# Patient Record
Sex: Female | Born: 1955
Health system: Southern US, Community
[De-identification: ages and names within clinical notes are randomized; demographics above are authoritative.]

## PROBLEM LIST (undated history)

## (undated) DIAGNOSIS — M1990 Unspecified osteoarthritis, unspecified site: Secondary | ICD-10-CM

## (undated) DIAGNOSIS — J189 Pneumonia, unspecified organism: Secondary | ICD-10-CM

## (undated) DIAGNOSIS — E119 Type 2 diabetes mellitus without complications: Secondary | ICD-10-CM

## (undated) DIAGNOSIS — Z87442 Personal history of urinary calculi: Secondary | ICD-10-CM

## (undated) DIAGNOSIS — K279 Peptic ulcer, site unspecified, unspecified as acute or chronic, without hemorrhage or perforation: Secondary | ICD-10-CM

## (undated) HISTORY — PX: LITHOTRIPSY: SUR834

## (undated) HISTORY — PX: ANKLE ARTHROSCOPY W/ OPEN REPAIR: SHX1145

---

## 1982-09-11 HISTORY — PX: TUBAL LIGATION: SHX77

## 2005-11-01 ENCOUNTER — Ambulatory Visit (HOSPITAL_COMMUNITY): Admission: RE | Admit: 2005-11-01 | Discharge: 2005-11-01 | Payer: Self-pay | Admitting: Family Medicine

## 2009-05-13 ENCOUNTER — Ambulatory Visit (HOSPITAL_COMMUNITY): Admission: RE | Admit: 2009-05-13 | Discharge: 2009-05-13 | Payer: Self-pay | Admitting: Orthopaedic Surgery

## 2010-12-16 LAB — GLUCOSE, CAPILLARY

## 2010-12-17 LAB — COMPREHENSIVE METABOLIC PANEL
ALT: 22 U/L (ref 0–35)
AST: 18 U/L (ref 0–37)
Albumin: 3.5 g/dL (ref 3.5–5.2)
Alkaline Phosphatase: 55 U/L (ref 39–117)
BUN: 8 mg/dL (ref 6–23)
CO2: 25 mEq/L (ref 19–32)
Calcium: 9.3 mg/dL (ref 8.4–10.5)
GFR calc Af Amer: >60 mL/min (ref >60)
GFR calc non Af Amer: >60 mL/min (ref >60)
Glucose, Bld: 112 mg/dL — ABNORMAL HIGH (ref 70–99)
Potassium: 3.7 mEq/L (ref 3.5–5.1)
Total Bilirubin: 0.6 mg/dL (ref 0.3–1.2)
Total Protein: 6.2 g/dL (ref 6.0–8.3)

## 2010-12-17 LAB — CBC
Hemoglobin: 12.8 g/dL (ref 12.0–15.0)
Platelets: 355 10*3/uL (ref 150–400)
RBC: 4.12 MIL/uL (ref 3.87–5.11)
WBC: 10.1 10*3/uL (ref 4.0–10.5)

## 2010-12-17 LAB — URINE MICROSCOPIC-ADD ON

## 2010-12-17 LAB — URINALYSIS, ROUTINE W REFLEX MICROSCOPIC: Urobilinogen, UA: 0.2 mg/dL (ref 0.0–1.0)

## 2010-12-17 LAB — DIFFERENTIAL
Basophils Absolute: 0 10*3/uL (ref 0.0–0.1)
Basophils Relative: 0 % (ref 0–1)
Eosinophils Absolute: 0.4 10*3/uL (ref 0.0–0.7)
Neutrophils Relative %: 64 % (ref 43–77)

## 2011-01-24 NOTE — H&P (Signed)
NAMEDORIE, Sarah Kent                ACCOUNT NO.:  [id number]   MEDICAL RECORD NO.:  [id number]          PATIENT TYPE:  AMB   LOCATION:  DAY                           FACILITY:  [facility]   PHYSICIAN:  J. Darreld Mclean, M.D. DATE OF BIRTH:  15-Nov-1955   DATE OF ADMISSION:  DATE OF DISCHARGE:  LH                              HISTORY & PHYSICAL   She is scheduled for surgery tomorrow the 2nd.   CHIEF COMPLAINT:  Right knee pain.   SUBJECTIVE:  The patient is a 55 year old female with pain and  tenderness in her right knee for several months.  She has been having  intermittent pain to the right knee for some time now.  I saw her for  this in April of this year.  Her pain in the knee has gotten worse.  Previously, I have seen her over the years with mild pain into the right  knee.  Knee pain got progressively worse during the summer and an MRI  was obtained at [facility].  MRI was done because of poor response to anti-  inflammatories and the usual taking her weight off of it and an  injection.  MRI showed a tear of the posterior on the medial meniscus  with a section extending to the meniscal root and effusion,  tricompartmental arthritis.  She also had a large effusion.  Surgery was  recommended as her poor response to conservative treatment.  Risks and  imponderables of the procedure were explained to her.   PAST MEDICAL HISTORY:  History is positive for a history of ulcer  disease.   ALLERGIES:  She has no allergies.   CURRENT MEDICATIONS:  1. Norethindrone 5 mg 2 daily.  2. Metformin 500 mg 2 daily.  3. ASA 81 mg daily.  4. Lisinopril 10 mg daily.  5. Calcitrate, calcium and vitamin D3.  6. She takes Vicodin 5 for pain.   PAST SURGICAL HISTORY:  1. The patient is status post tubal ligation in 1986.  2. Trimalleolar fracture of her left ankle on August 18, 1985, by me      with subsequent removal of screws the following year.  3. Tonsillectomy in 1987.   FAMILY HISTORY:   History of arthritis runs in the family.   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   PHYSICAL EXAMINATION:  VITAL SIGNS: Normal.  HEENT: Negative.  NECK:  Supple.  LUNGS: Clear to P and A.  HEART: Regular without murmur.  ABDOMEN:  Soft, nontender without masses.  EXTREMITIES:  Right knee has a mild effusion.  Pain and tenderness  particularly medially with a positive medial McMurray's.  Range of  motion was from 0-95 degrees with pain.  Other extremities are negative.  There is a well-healed scar to the left ankle with full range of motion.  CNS: Intact.  SKIN:  Intact.   IMPRESSION:  Tear of medial meniscus right knee with degenerative joint  disease.   PLAN:  Arthroscopy of the right knee.  Discussed with the patient  planned  procedure, risks and imponderables.  She appears to understand.  Labs are pending.                                            ______________________________  J. Darreld Mclean, M.D.     JWK/MEDQ  D:  05/12/2009  T:  05/12/2009  Job:  [id number]

## 2011-01-24 NOTE — Op Note (Signed)
Sarah, Kent                ACCOUNT NO.:  [id number]   MEDICAL RECORD NO.:  [id number]          PATIENT TYPE:  AMB   LOCATION:  DAY                           FACILITY:  [facility]   PHYSICIAN:  J. Darreld Mclean, M.D. DATE OF BIRTH:  March 04, 1956   DATE OF PROCEDURE:  DATE OF DISCHARGE:                               OPERATIVE REPORT   PREOPERATIVE DIAGNOSES:  Tear of the right knee, medial meniscus with  degenerative joint disease.   POSTOPERATIVE DIAGNOSES:  Tear of the right knee, medial meniscus with  degenerative joint disease.   PROCEDURES:  Operative arthroscopy, partial medial meniscectomy of the  right knee.   ANESTHESIA:  General.   TOURNIQUET TIME:  25 minutes.   DRAINS:  None.   INDICATIONS:  The patient is a 55 year old female with pain and  tenderness in her right knee.  It has been progressive, it was not  getting better, it has not improved with conservative treatment, rest,  injection, and antiinflammatories.  MRI shows tear of the medial  meniscus with degenerative joint disease as well.  Surgery has been  recommended.  Risks and imponderables were discussed preoperatively and  the patient appeared to understand and agree with the procedure as  outlined.   The patient was seen in the holding area.  The right knee was identified  as correct surgical site, she placed a mark on the right knee, I placed  a mark on the right knee.  She was brought to the operating room, given  general anesthesia while supine.  Tourniquet and leg holder were placed,  deflated right upper thigh.  She was prepped and draped in usual  fashion.  At a generalized time-out, identified the patient as Mr.  Halberg between the right knee.  All instrumentation deemed to be  properly working and in position.  Leg was elevated, wrapped  circumferentially with an Esmarch bandage.  Tourniquet inflated to 300  mmHg.  Esmarch bandage removed.  Inflow cannula inserted medially and  lactated Ringer  instilled in knee by an infusion pump.  Arthroscope was  inserted laterally.   The knee was systematically examined.  Suprapatellar pouch had mild  grade 2 changes undersurface of the patella and mild synovitis.  Medially, there was a grade 3 to grade 4 changes of the tibial plateau  and the femoral condyle most medially.  There was significant bone  exposure, eburnation in couple places.  There was a tear at the  posterior horn of the medial meniscus.  Anterior cruciate was intact.  Laterally, the knee looked very good with grade 2 changes, some slight  fraying around the meniscus edges.  Attention directed back to the  medial side and meniscal probe, meniscal knife and a meniscal shaver,  the meniscus was removed in the posterior portion where the tear was.  Good smooth contour was obtained.  Meniscal punch was used as well.  Pertinent pictures were taken.  The knee was then systematically  reexamined and no new pathology found.  Knee was irrigated with main  part of lactated Ringer.  Wounds were reapproximated using  3-0 nylon  interrupted vertical mattress manner.  Marcaine 0.25%  instilled at each portal.  Tourniquet deflated after 25 minutes.  The  patient was given a bulky dressing.  She was given Vicodin ES for pain.  I will see in the office in approximately 10 days 2 weeks.  Physical  therapy has been arranged.  If any difficulties, contact me through the  office hospital beeper system.           ______________________________  Shela Commons. Darreld Mclean, M.D.     JWK/MEDQ  D:  05/13/2009  T:  05/13/2009  Job:  [id number]

## 2011-08-10 ENCOUNTER — Ambulatory Visit (HOSPITAL_COMMUNITY)
Admission: RE | Admit: 2011-08-10 | Discharge: 2011-08-10 | Disposition: A | Discharge status: Home / Self Care | Payer: 59 | Source: Ambulatory Visit | Attending: Urology | Admitting: Urology

## 2011-08-10 ENCOUNTER — Other Ambulatory Visit (HOSPITAL_COMMUNITY): Payer: Self-pay | Admitting: Urology

## 2011-08-10 DIAGNOSIS — N200 Calculus of kidney: Secondary | ICD-10-CM

## 2011-08-10 DIAGNOSIS — Z09 Encounter for follow-up examination after completed treatment for conditions other than malignant neoplasm: Secondary | ICD-10-CM | POA: Insufficient documentation

## 2015-11-24 ENCOUNTER — Ambulatory Visit: Payer: Self-pay | Admitting: Orthopaedic Surgery

## 2015-12-01 ENCOUNTER — Ambulatory Visit (INDEPENDENT_AMBULATORY_CARE_PROVIDER_SITE_OTHER): Payer: Commercial Managed Care - HMO | Admitting: Orthopaedic Surgery

## 2015-12-01 VITALS — BP 114/69 | HR 94 | Temp 97.5°F | Ht 62.0 in | Wt 219.4 lb

## 2015-12-01 DIAGNOSIS — M25561 Pain in right knee: Secondary | ICD-10-CM | POA: Diagnosis not present

## 2015-12-01 NOTE — Progress Notes (Addendum)
Sarah Kent, female DOB:May 15, 1956, 60 y.o. AVW:[id number]  Chief Complaint  Sarah presents with  . Follow-up    Right Knee follow up "same"    HPI  Sarah Kent is a 60 y.o. female who has chronic knee pain of both knees, more on the right. She has no new trauma.  She has swelling and popping. She has no giving way, no redness, no locking.  She takes her medicine.  Knee Pain  The pain is present in the right knee. The quality of the pain is described as aching. The pain is at a severity of 3/10. The pain is mild. The pain has been fluctuating since onset. The symptoms are aggravated by weight bearing. She has tried NSAIDs, rest, ice and heat for the symptoms. The treatment provided moderate relief.    Body mass index is 40.12 kg/(m^2).  Review of Systems  Sarah has Diabetes Mellitus. Sarah does not have hypertension. Sarah does not have COPD or shortness of breath. Sarah has BMI > 35. Sarah does not have current smoking history.  Review of Systems  HENT: Negative for congestion.   Respiratory: Negative for shortness of breath.   Cardiovascular: Negative for chest pain.  Endocrine: Positive for cold intolerance.  Musculoskeletal: Positive for joint swelling, arthralgias and gait problem.  Allergic/Immunologic: Positive for environmental allergies.    No past medical history on file.  No past surgical history on file.  No family history on file.  Social History Social History  Substance Use Topics  . Smoking status: Not on file  . Smokeless tobacco: Not on file  . Alcohol Use: Not on file    No Known Allergies  Current Outpatient Prescriptions  Medication Sig Dispense Refill  . aspirin 81 MG tablet Take 81 mg by mouth daily.    . Liraglutide (VICTOZA) 18 MG/3ML SOPN Inject 1.2 mg into the skin once.    Marland Kitchen lisinopril (PRINIVIL,ZESTRIL) 10 MG tablet Take 10 mg by mouth daily.    . metFORMIN (GLUCOPHAGE) 500 MG tablet Take 500 mg by mouth 2  (two) times daily with a meal.    . simvastatin (ZOCOR) 10 MG tablet Take 10 mg by mouth daily.     No current facility-administered medications for this visit.     Physical Exam  Blood pressure 114/69, pulse 94, temperature 97.5 F (36.4 C), height  (1.575 m), weight 219 lb 6.4 oz (99.519 kg).  Constitutional: overall normal hygiene, normal nutrition, well developed, normal grooming, normal body habitus. Assistive device:none  Musculoskeletal: gait and station Limp right, muscle tone and strength are normal, no tremors or atrophy is present.  .  Neurological: coordination overall normal.  Deep tendon reflex/nerve stretch intact.  Sensation normal.  Cranial nerves II-XII intact.   Skin:   normal overall no scars, lesions, ulcers or rashes. No psoriasis.  Psychiatric: Alert and oriented x 3.  Recent memory intact, remote memory unclear.  Normal mood and affect. Well groomed.  Good eye contact.  Cardiovascular: overall no swelling, no varicosities, no edema bilaterally, normal temperatures of the legs and arms, no clubbing, cyanosis and good capillary refill.  Lymphatic: palpation is normal.  The right lower extremity is examined:  Inspection:  Thigh:  Non-tender and no defects  Knee has swelling 2+ effusion.                        Joint tenderness is present  Sarah is tender over the medial joint line  Lower Leg:  Has normal appearance and no tenderness or defects  Ankle:  Non-tender and no defects  Foot:  Non-tender and no defects Range of Motion:  Knee:  Range of motion is: 0-100                        Crepitus is  present  Ankle:  Range of motion is normal. Strength and Tone:  The right lower extremity has normal strength and tone. Stability:  Knee:  The knee is stable.  Ankle:  The ankle is stable.    The Sarah has been educated about the nature of the problem(s) and counseled on treatment options.  The Sarah appeared to understand  what I have discussed and is in agreement with it.  PLAN Call if any problems.  Precautions discussed.  Continue current medications.   Return to clinic PRN

## 2016-01-12 ENCOUNTER — Telehealth: Payer: Self-pay | Admitting: Orthopaedic Surgery

## 2016-01-12 NOTE — Telephone Encounter (Signed)
Hydrocodone-Acetaminophen 7.5/325mg Qty 120 Tablets °

## 2016-01-13 MED ORDER — HYDROCODONE-ACETAMINOPHEN 7.5-325 MG PO TABS: 1.0000 | ORAL_TABLET | ORAL | Status: DC | PRN

## 2016-01-13 NOTE — Telephone Encounter (Signed)
Rx done. 

## 2016-06-21 ENCOUNTER — Ambulatory Visit (INDEPENDENT_AMBULATORY_CARE_PROVIDER_SITE_OTHER): Payer: Commercial Managed Care - HMO | Admitting: Orthopaedic Surgery

## 2016-06-21 ENCOUNTER — Encounter: Payer: Self-pay | Admitting: Orthopaedic Surgery

## 2016-06-21 VITALS — BP 116/60 | HR 100 | Temp 97.7°F | Ht 62.0 in | Wt 210.0 lb

## 2016-06-21 DIAGNOSIS — G8929 Other chronic pain: Secondary | ICD-10-CM | POA: Diagnosis not present

## 2016-06-21 DIAGNOSIS — M25561 Pain in right knee: Secondary | ICD-10-CM

## 2016-06-21 MED ORDER — HYDROCODONE-ACETAMINOPHEN 7.5-325 MG PO TABS: 1.0000 | ORAL_TABLET | ORAL | 0 refills | Status: DC | PRN

## 2016-06-21 NOTE — Progress Notes (Signed)
Patient ZO:XWRUE:Sarah Kent, female DOB:[birth year], 60 y.o. AVW:[id number]  Chief Complaint  Patient presents with  . Follow-up    Right knee pain    HPI  Sarah SquiresKathy D Kent is a 60 y.o. female who has chronic pain of the right knee. She has no new trauma.  She has swelling and popping. She is worse on rainy days and first thing in the mornings.  She has no giving way, no redness.  She is active. HPI  Body mass index is 38.41 kg/m.  ROS  Review of Systems  HENT: Negative for congestion.   Respiratory: Negative for shortness of breath.   Cardiovascular: Negative for chest pain.  Endocrine: Positive for cold intolerance.  Musculoskeletal: Positive for arthralgias, gait problem and joint swelling.  Allergic/Immunologic: Positive for environmental allergies.    History reviewed. No pertinent past medical history.  History reviewed. No pertinent surgical history.  History reviewed. No pertinent family history.  Social History Social History  Substance Use Topics  . Smoking status: Never Smoker  . Smokeless tobacco: Never Used  . Alcohol use No    No Known Allergies  Current Outpatient Prescriptions  Medication Sig Dispense Refill  . aspirin 81 MG tablet Take 81 mg by mouth daily.    Marland Kitchen. HYDROcodone-acetaminophen (NORCO) 7.5-325 MG tablet Take 1 tablet by mouth every 4 (four) hours as needed for moderate pain (Must last 30 days.  Do not drive or operate machinery while taking this medicine.). 120 tablet 0  . Liraglutide (VICTOZA) 18 MG/3ML SOPN Inject 1.2 mg into the skin once.    Marland Kitchen. lisinopril (PRINIVIL,ZESTRIL) 10 MG tablet Take 10 mg by mouth daily.    . metFORMIN (GLUCOPHAGE) 500 MG tablet Take 500 mg by mouth 2 (two) times daily with a meal.    . simvastatin (ZOCOR) 10 MG tablet Take 10 mg by mouth daily.     No current facility-administered medications for this visit.      Physical Exam  Blood pressure 116/60, pulse 100, temperature 97.7 F (36.5 C), height 5\' 2"   (1.575 m), weight 210 lb (95.3 kg).  Constitutional: overall normal hygiene, normal nutrition, well developed, normal grooming, normal body habitus. Assistive device:none  Musculoskeletal: gait and station Limp right, muscle tone and strength are normal, no tremors or atrophy is present.  .  Neurological: coordination overall normal.  Deep tendon reflex/nerve stretch intact.  Sensation normal.  Cranial nerves II-XII intact.   Skin:   Normal overall no scars, lesions, ulcers or rashes. No psoriasis.  Psychiatric: Alert and oriented x 3.  Recent memory intact, remote memory unclear.  Normal mood and affect. Well groomed.  Good eye contact.  Cardiovascular: overall no swelling, no varicosities, no edema bilaterally, normal temperatures of the legs and arms, no clubbing, cyanosis and good capillary refill.  Lymphatic: palpation is normal.  The right lower extremity is examined:  Inspection:  Thigh:  Non-tender and no defects  Knee has swelling 1+ effusion.                        Joint tenderness is present                        Patient is tender over the medial joint line  Lower Leg:  Has normal appearance and no tenderness or defects  Ankle:  Non-tender and no defects  Foot:  Non-tender and no defects Range of Motion:  Knee:  Range of motion  is: 0-105                        Crepitus is  present  Ankle:  Range of motion is normal. Strength and Tone:  The right lower extremity has normal strength and tone. Stability:  Knee:  The knee is stable.  Ankle:  The ankle is stable.    The patient has been educated about the nature of the problem(s) and counseled on treatment options.  The patient appeared to understand what I have discussed and is in agreement with it.  Encounter Diagnosis  Name Primary?  . Chronic pain of right knee Yes    PLAN Call if any problems.  Precautions discussed.  Continue current medications.   Return to clinic prn   Electronically Signed Darreld Mclean, MD 10/11/20179:25 AM

## 2016-10-06 DIAGNOSIS — R05 Cough: Secondary | ICD-10-CM | POA: Diagnosis not present

## 2017-01-09 DIAGNOSIS — E782 Mixed hyperlipidemia: Secondary | ICD-10-CM | POA: Diagnosis not present

## 2017-01-09 DIAGNOSIS — E7800 Pure hypercholesterolemia, unspecified: Secondary | ICD-10-CM | POA: Diagnosis not present

## 2017-01-09 DIAGNOSIS — E1165 Type 2 diabetes mellitus with hyperglycemia: Secondary | ICD-10-CM | POA: Diagnosis not present

## 2017-01-09 DIAGNOSIS — I10 Essential (primary) hypertension: Secondary | ICD-10-CM | POA: Diagnosis not present

## 2017-01-16 DIAGNOSIS — I10 Essential (primary) hypertension: Secondary | ICD-10-CM | POA: Diagnosis not present

## 2017-01-16 DIAGNOSIS — E1140 Type 2 diabetes mellitus with diabetic neuropathy, unspecified: Secondary | ICD-10-CM | POA: Diagnosis not present

## 2017-01-16 DIAGNOSIS — E782 Mixed hyperlipidemia: Secondary | ICD-10-CM | POA: Diagnosis not present

## 2017-03-01 ENCOUNTER — Ambulatory Visit (INDEPENDENT_AMBULATORY_CARE_PROVIDER_SITE_OTHER): Payer: 59 | Admitting: Orthopaedic Surgery

## 2017-03-01 ENCOUNTER — Encounter: Payer: Self-pay | Admitting: Orthopaedic Surgery

## 2017-03-01 VITALS — BP 127/61 | HR 94 | Temp 97.5°F | Ht 62.0 in | Wt 213.0 lb

## 2017-03-01 DIAGNOSIS — M25561 Pain in right knee: Secondary | ICD-10-CM | POA: Diagnosis not present

## 2017-03-01 DIAGNOSIS — G8929 Other chronic pain: Secondary | ICD-10-CM

## 2017-03-01 MED ORDER — HYDROCODONE-ACETAMINOPHEN 7.5-325 MG PO TABS: 1.0000 | ORAL_TABLET | ORAL | 0 refills | Status: DC | PRN

## 2017-03-01 NOTE — Progress Notes (Signed)
Patient MV:HQION:Sarah Kent, female DOB:[birth year], 61 y.o. GEX:[id number]  Chief Complaint  Patient presents with  . Follow-up    Right Knee    HPI  Sarah Kent is a 61 y.o. female who has chronic pain of the her right knee.  She has swelling and popping but no giving way.  She has no redness or trauma.  She is active.  She went up to [employer] [place] last week and has more pain. HPI  Body mass index is 38.96 kg/m.  ROS  Review of Systems  HENT: Negative for congestion.   Respiratory: Negative for shortness of breath.   Cardiovascular: Negative for chest pain.  Endocrine: Positive for cold intolerance.  Musculoskeletal: Positive for arthralgias, gait problem and joint swelling.  Allergic/Immunologic: Positive for environmental allergies.    History reviewed. No pertinent past medical history.  History reviewed. No pertinent surgical history.  History reviewed. No pertinent family history.  Social History Social History  Substance Use Topics  . Smoking status: Never Smoker  . Smokeless tobacco: Never Used  . Alcohol use No    No Known Allergies  Current Outpatient Prescriptions  Medication Sig Dispense Refill  . aspirin 81 MG tablet Take 81 mg by mouth daily.    Marland Kitchen. HYDROcodone-acetaminophen (NORCO) 7.5-325 MG tablet Take 1 tablet by mouth every 4 (four) hours as needed for moderate pain (Must last 30 days.  Do not drive or operate machinery while taking this medicine.). 120 tablet 0  . Liraglutide (VICTOZA) 18 MG/3ML SOPN Inject 1.2 mg into the skin once.    Marland Kitchen. lisinopril (PRINIVIL,ZESTRIL) 10 MG tablet Take 10 mg by mouth daily.    . metFORMIN (GLUCOPHAGE) 500 MG tablet Take 500 mg by mouth 2 (two) times daily with a meal.    . simvastatin (ZOCOR) 10 MG tablet Take 10 mg by mouth daily.     No current facility-administered medications for this visit.      Physical Exam  Blood pressure 127/61, pulse 94, temperature 97.5 F (36.4 C), height 5\' 2"  (1.575 m),  weight 213 lb (96.6 kg).  Constitutional: overall normal hygiene, normal nutrition, well developed, normal grooming, normal body habitus. Assistive device:none  Musculoskeletal: gait and station Limp right, muscle tone and strength are normal, no tremors or atrophy is present.  .  Neurological: coordination overall normal.  Deep tendon reflex/nerve stretch intact.  Sensation normal.  Cranial nerves II-XII intact.   Skin:   Normal overall no scars, lesions, ulcers or rashes. No psoriasis.  Psychiatric: Alert and oriented x 3.  Recent memory intact, remote memory unclear.  Normal mood and affect. Well groomed.  Good eye contact.  Cardiovascular: overall no swelling, no varicosities, no edema bilaterally, normal temperatures of the legs and arms, no clubbing, cyanosis and good capillary refill.  Lymphatic: palpation is normal.  The right lower extremity is examined:  Inspection:  Thigh:  Non-tender and no defects  Knee has swelling 1+ effusion.                        Joint tenderness is present                        Patient is tender over the medial joint line  Lower Leg:  Has normal appearance and no tenderness or defects  Ankle:  Non-tender and no defects  Foot:  Non-tender and no defects Range of Motion:  Knee:  Range of motion is:  0-105                        Crepitus is  present  Ankle:  Range of motion is normal. Strength and Tone:  The right lower extremity has normal strength and tone. Stability:  Knee:  The knee is stable.  Ankle:  The ankle is stable.    The patient has been educated about the nature of the problem(s) and counseled on treatment options.  The patient appeared to understand what I have discussed and is in agreement with it.  Encounter Diagnosis  Name Primary?  . Chronic pain of right knee Yes    PLAN Call if any problems.  Precautions discussed.  Continue current medications.   Return to clinic prn   I have reviewed the [facility]  Controlled Substance Reporting System web site prior to prescribing narcotic medicine for this patient.  Electronically Signed Darreld Mclean, MD 6/21/201810:04 AM

## 2017-03-05 ENCOUNTER — Telehealth: Payer: Self-pay | Admitting: Orthopaedic Surgery

## 2017-03-05 NOTE — Telephone Encounter (Signed)
Patient called and stated that her insurance, which is [employer], will not cover her Hydrocodone.  [phone] to see what needs to be done so that she may get her medication.  Thanks

## 2017-03-07 NOTE — Telephone Encounter (Signed)
I spoke with the patient and explained that Dr. Hilda Kent can not keep writing the pain medication.  However, her family doctor can take over and possibly get the approval since that Dr is responsible for her over all health and regulates all of her medicine.

## 2017-05-09 DIAGNOSIS — E1122 Type 2 diabetes mellitus with diabetic chronic kidney disease: Secondary | ICD-10-CM | POA: Diagnosis not present

## 2017-05-09 DIAGNOSIS — E1165 Type 2 diabetes mellitus with hyperglycemia: Secondary | ICD-10-CM | POA: Diagnosis not present

## 2017-05-09 DIAGNOSIS — E1140 Type 2 diabetes mellitus with diabetic neuropathy, unspecified: Secondary | ICD-10-CM | POA: Diagnosis not present

## 2017-05-21 DIAGNOSIS — Z23 Encounter for immunization: Secondary | ICD-10-CM | POA: Diagnosis not present

## 2017-05-21 DIAGNOSIS — I10 Essential (primary) hypertension: Secondary | ICD-10-CM | POA: Diagnosis not present

## 2017-05-21 DIAGNOSIS — E1165 Type 2 diabetes mellitus with hyperglycemia: Secondary | ICD-10-CM | POA: Diagnosis not present

## 2017-05-21 DIAGNOSIS — E782 Mixed hyperlipidemia: Secondary | ICD-10-CM | POA: Diagnosis not present

## 2017-06-13 DIAGNOSIS — Z23 Encounter for immunization: Secondary | ICD-10-CM | POA: Diagnosis not present

## 2017-06-29 DIAGNOSIS — Z1231 Encounter for screening mammogram for malignant neoplasm of breast: Secondary | ICD-10-CM | POA: Diagnosis not present

## 2017-07-18 DIAGNOSIS — Z01419 Encounter for gynecological examination (general) (routine) without abnormal findings: Secondary | ICD-10-CM | POA: Diagnosis not present

## 2017-09-12 DIAGNOSIS — E1122 Type 2 diabetes mellitus with diabetic chronic kidney disease: Secondary | ICD-10-CM | POA: Diagnosis not present

## 2017-09-12 DIAGNOSIS — E7800 Pure hypercholesterolemia, unspecified: Secondary | ICD-10-CM | POA: Diagnosis not present

## 2017-09-12 DIAGNOSIS — E1165 Type 2 diabetes mellitus with hyperglycemia: Secondary | ICD-10-CM | POA: Diagnosis not present

## 2017-09-12 DIAGNOSIS — Z0000 Encounter for general adult medical examination without abnormal findings: Secondary | ICD-10-CM | POA: Diagnosis not present

## 2017-09-18 DIAGNOSIS — E1165 Type 2 diabetes mellitus with hyperglycemia: Secondary | ICD-10-CM | POA: Diagnosis not present

## 2017-09-18 DIAGNOSIS — E782 Mixed hyperlipidemia: Secondary | ICD-10-CM | POA: Diagnosis not present

## 2017-09-18 DIAGNOSIS — I10 Essential (primary) hypertension: Secondary | ICD-10-CM | POA: Diagnosis not present

## 2017-10-18 ENCOUNTER — Ambulatory Visit: Payer: 59 | Admitting: Orthopaedic Surgery

## 2017-10-18 ENCOUNTER — Encounter: Payer: Self-pay | Admitting: Orthopaedic Surgery

## 2017-10-18 VITALS — BP 114/58 | HR 101 | Temp 98.2°F | Ht 62.0 in | Wt 214.0 lb

## 2017-10-18 DIAGNOSIS — G8929 Other chronic pain: Secondary | ICD-10-CM

## 2017-10-18 DIAGNOSIS — I10 Essential (primary) hypertension: Secondary | ICD-10-CM | POA: Insufficient documentation

## 2017-10-18 DIAGNOSIS — M25561 Pain in right knee: Secondary | ICD-10-CM | POA: Diagnosis not present

## 2017-10-18 MED ORDER — HYDROCODONE-ACETAMINOPHEN 7.5-325 MG PO TABS: 1.0000 | ORAL_TABLET | Freq: Four times a day (QID) | ORAL | 0 refills | Status: DC | PRN

## 2017-10-18 NOTE — Progress Notes (Signed)
Patient ZO:XWRUE:Sarah Kent, female DOB:1956-07-08, 62 y.o. AVW:[id number]  Chief Complaint  Patient presents with  . Knee Pain    right    HPI  Sarah Kent is a 62 y.o. female who has chronic pain of the right knee.  She has popping, swelling.  She is a candidate for a total knee.  She prefers to hold off for now.  She has good and bad days, worse on cold rainy days.  She has no new trauma. HPI  Body mass index is 39.14 kg/m.  ROS  Review of Systems  HENT: Negative for congestion.   Respiratory: Negative for shortness of breath.   Cardiovascular: Negative for chest pain.  Endocrine: Positive for cold intolerance.  Musculoskeletal: Positive for arthralgias, gait problem and joint swelling.  Allergic/Immunologic: Positive for environmental allergies.  All other systems reviewed and are negative.   History reviewed. No pertinent past medical history.  History reviewed. No pertinent surgical history.  History reviewed. No pertinent family history.  Social History Social History   Tobacco Use  . Smoking status: Never Smoker  . Smokeless tobacco: Never Used  Substance Use Topics  . Alcohol use: No  . Drug use: No    No Known Allergies  Current Outpatient Medications  Medication Sig Dispense Refill  . aspirin 81 MG tablet Take 81 mg by mouth daily.    Marland Kitchen. HYDROcodone-acetaminophen (NORCO) 7.5-325 MG tablet Take 1 tablet by mouth every 6 (six) hours as needed for moderate pain (Must last 30 days). 120 tablet 0  . Liraglutide (VICTOZA) 18 MG/3ML SOPN Inject 1.2 mg into the skin once.    Marland Kitchen. lisinopril (PRINIVIL,ZESTRIL) 10 MG tablet Take 10 mg by mouth daily.    . metFORMIN (GLUCOPHAGE) 500 MG tablet Take 500 mg by mouth 2 (two) times daily with a meal.    . simvastatin (ZOCOR) 10 MG tablet Take 10 mg by mouth daily.     No current facility-administered medications for this visit.      Physical Exam  Blood pressure (!) 114/58, pulse (!) 101, temperature 98.2 F (36.8  C), height 5\' 2"  (1.575 m), weight 214 lb (97.1 kg).  Constitutional: overall normal hygiene, normal nutrition, well developed, normal grooming, normal body habitus. Assistive device:none  Musculoskeletal: gait and station Limp right, muscle tone and strength are normal, no tremors or atrophy is present.  .  Neurological: coordination overall normal.  Deep tendon reflex/nerve stretch intact.  Sensation normal.  Cranial nerves II-XII intact.   Skin:   Normal overall no scars, lesions, ulcers or rashes. No psoriasis.  Psychiatric: Alert and oriented x 3.  Recent memory intact, remote memory unclear.  Normal mood and affect. Well groomed.  Good eye contact.  Cardiovascular: overall no swelling, no varicosities, no edema bilaterally, normal temperatures of the legs and arms, no clubbing, cyanosis and good capillary refill.  Lymphatic: palpation is normal.  The right lower extremity is examined:  Inspection:  Thigh:  Non-tender and no defects  Knee has swelling 1+ effusion.                        Joint tenderness is present                        Patient is tender over the medial joint line  Lower Leg:  Has normal appearance and no tenderness or defects  Ankle:  Non-tender and no defects  Foot:  Non-tender and  no defects Range of Motion:  Knee:  Range of motion is: 0-105                        Crepitus is  present  Ankle:  Range of motion is normal. Strength and Tone:  The right lower extremity has normal strength and tone. Stability:  Knee:  The knee is stable.  Ankle:  The ankle is stable.   All other systems reviewed and are negative   The patient has been educated about the nature of the problem(s) and counseled on treatment options.  The patient appeared to understand what I have discussed and is in agreement with it.  Encounter Diagnosis  Name Primary?  . Chronic pain of right knee Yes    PLAN Call if any problems.  Precautions discussed.  Continue current  medications.   Return to clinic prn   I have reviewed the [facility] Controlled Substance Reporting System web site prior to prescribing narcotic medicine for this patient.  Electronically Signed Darreld Mclean, MD 2/7/201910:56 AM

## 2018-01-09 DIAGNOSIS — E1165 Type 2 diabetes mellitus with hyperglycemia: Secondary | ICD-10-CM | POA: Diagnosis not present

## 2018-01-09 DIAGNOSIS — E1140 Type 2 diabetes mellitus with diabetic neuropathy, unspecified: Secondary | ICD-10-CM | POA: Diagnosis not present

## 2018-01-09 DIAGNOSIS — E1122 Type 2 diabetes mellitus with diabetic chronic kidney disease: Secondary | ICD-10-CM | POA: Diagnosis not present

## 2018-01-16 DIAGNOSIS — E1165 Type 2 diabetes mellitus with hyperglycemia: Secondary | ICD-10-CM | POA: Diagnosis not present

## 2018-01-16 DIAGNOSIS — I10 Essential (primary) hypertension: Secondary | ICD-10-CM | POA: Diagnosis not present

## 2018-01-16 DIAGNOSIS — E782 Mixed hyperlipidemia: Secondary | ICD-10-CM | POA: Diagnosis not present

## 2018-05-03 DIAGNOSIS — E1140 Type 2 diabetes mellitus with diabetic neuropathy, unspecified: Secondary | ICD-10-CM | POA: Diagnosis not present

## 2018-05-03 DIAGNOSIS — E1165 Type 2 diabetes mellitus with hyperglycemia: Secondary | ICD-10-CM | POA: Diagnosis not present

## 2018-05-03 DIAGNOSIS — E1122 Type 2 diabetes mellitus with diabetic chronic kidney disease: Secondary | ICD-10-CM | POA: Diagnosis not present

## 2018-05-06 DIAGNOSIS — I10 Essential (primary) hypertension: Secondary | ICD-10-CM | POA: Diagnosis not present

## 2018-05-06 DIAGNOSIS — E1165 Type 2 diabetes mellitus with hyperglycemia: Secondary | ICD-10-CM | POA: Diagnosis not present

## 2018-05-06 DIAGNOSIS — E782 Mixed hyperlipidemia: Secondary | ICD-10-CM | POA: Diagnosis not present

## 2018-06-11 DIAGNOSIS — J019 Acute sinusitis, unspecified: Secondary | ICD-10-CM | POA: Diagnosis not present

## 2018-06-11 DIAGNOSIS — Z6836 Body mass index (BMI) 36.0-36.9, adult: Secondary | ICD-10-CM | POA: Diagnosis not present

## 2018-06-18 DIAGNOSIS — H40013 Open angle with borderline findings, low risk, bilateral: Secondary | ICD-10-CM | POA: Diagnosis not present

## 2018-06-19 DIAGNOSIS — Z23 Encounter for immunization: Secondary | ICD-10-CM | POA: Diagnosis not present

## 2018-07-04 DIAGNOSIS — Z1231 Encounter for screening mammogram for malignant neoplasm of breast: Secondary | ICD-10-CM | POA: Diagnosis not present

## 2018-07-11 ENCOUNTER — Encounter: Payer: Self-pay | Admitting: Orthopaedic Surgery

## 2018-07-11 ENCOUNTER — Ambulatory Visit: Payer: 59 | Admitting: Orthopaedic Surgery

## 2018-07-11 ENCOUNTER — Telehealth: Payer: Self-pay | Admitting: Orthopaedic Surgery

## 2018-07-11 VITALS — BP 125/77 | HR 90 | Ht 62.0 in | Wt 207.0 lb

## 2018-07-11 DIAGNOSIS — G8929 Other chronic pain: Secondary | ICD-10-CM

## 2018-07-11 DIAGNOSIS — M25561 Pain in right knee: Secondary | ICD-10-CM

## 2018-07-11 MED ORDER — HYDROCODONE-ACETAMINOPHEN 7.5-325 MG PO TABS: 1.0000 | ORAL_TABLET | Freq: Four times a day (QID) | ORAL | 0 refills | Status: DC | PRN

## 2018-07-11 NOTE — Telephone Encounter (Signed)
Patient stated it was discussed about pain medication this morning at her visit and it was supposed to be sent, but the pharmacy hasn't received anything.  Hydrocodone-Acetaminophen  7.5/325 mg  Qty 120 Tablets?  PATIENT USES [facility]

## 2018-07-11 NOTE — Progress Notes (Signed)
Patient Sarah Kent, female DOB:15-Aug-1956, 62 y.o. JYN:[id number]  Chief Complaint  Patient presents with  . Knee Pain    HPI  Sarah Kent is a 62 y.o. female who has continuing pain of the right knee.  She has significant DJD.  She is here to talk about a total knee.  She has decided to proceed to have a total knee.  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  I do not do any surgery now.  She would like to see Dr. [name] in [place].  I will make an appointment for her to see him.  She would like to have the surgery in late spring if possible.     Body mass index is 37.86 kg/m.  ROS  Review of Systems  HENT: Negative for congestion.   Respiratory: Negative for shortness of breath.   Cardiovascular: Negative for chest pain.  Endocrine: Positive for cold intolerance.  Musculoskeletal: Positive for arthralgias, gait problem and joint swelling.  Allergic/Immunologic: Positive for environmental allergies.  All other systems reviewed and are negative.   All other systems reviewed and are negative.  The following is a summary of the past history medically, past history surgically, known current medicines, social history and family history.  This information is gathered electronically by the computer from prior information and documentation.  I review this each visit and have found including this information at this point in the chart is beneficial and informative.    No past medical history on file.  No past surgical history on file.  No family history on file.  Social History Social History   Tobacco Use  . Smoking status: Never Smoker  . Smokeless tobacco: Never Used  Substance Use Topics  . Alcohol use: No  . Drug use: No    No Known Allergies  Current Outpatient Medications  Medication Sig Dispense Refill  . aspirin 81 MG tablet Take 81 mg by mouth daily.    Marland Kitchen HYDROcodone-acetaminophen (NORCO) 7.5-325 MG tablet Take 1 tablet by mouth  every 6 (six) hours as needed for moderate pain (Must last 30 days). 120 tablet 0  . Liraglutide (VICTOZA) 18 MG/3ML SOPN Inject 1.2 mg into the skin once.    Marland Kitchen lisinopril (PRINIVIL,ZESTRIL) 10 MG tablet Take 10 mg by mouth daily.    . metFORMIN (GLUCOPHAGE) 500 MG tablet Take 500 mg by mouth 2 (two) times daily with a meal.    . simvastatin (ZOCOR) 10 MG tablet Take 10 mg by mouth daily.     No current facility-administered medications for this visit.      Physical Exam  Blood pressure 125/77, pulse 90, height 5\' 2"  (1.575 m), weight 207 lb (93.9 kg).  Constitutional: overall normal hygiene, normal nutrition, well developed, normal grooming, normal body habitus. Assistive device:none  Musculoskeletal: gait and station Limp right, muscle tone and strength are normal, no tremors or atrophy is present.  .  Neurological: coordination overall normal.  Deep tendon reflex/nerve stretch intact.  Sensation normal.  Cranial nerves II-XII intact.   Skin:   Normal overall no scars, lesions, ulcers or rashes. No psoriasis.  Psychiatric: Alert and oriented x 3.  Recent memory intact, remote memory unclear.  Normal mood and affect. Well groomed.  Good eye contact.  Cardiovascular: overall no swelling, no varicosities, no edema bilaterally, normal temperatures of the legs and arms, no clubbing, cyanosis and good capillary refill.  Lymphatic: palpation is normal.  Right knee has crepitus and effusion,  ROM 0 to 105 with some pain.  Limp to the right.  Muscle tone and strength normal.  NV intact.  All other systems reviewed and are negative   The patient has been educated about the nature of the problem(s) and counseled on treatment options.  The patient appeared to understand what I have discussed and is in agreement with it.  Encounter Diagnosis  Name Primary?  . Chronic pain of right knee Yes    PLAN Call if any problems.  Precautions discussed.  Continue current medications.   Return to  clinic to see Dr. [name] for possible total knee on the right.   Electronically Signed Darreld Mclean, MD 10/31/20198:45 AM

## 2018-07-23 ENCOUNTER — Telehealth: Payer: Self-pay | Admitting: Orthopaedic Surgery

## 2018-07-23 NOTE — Telephone Encounter (Signed)
Spoke with Sarah Kent who states Dr. [name] office needed more information but was very vague on what they needed. Attempted to call [facility] with no answer. Informed Sarah Kent that I will call office again tomorrow to see if there's something that needs to be provided from this office.

## 2018-07-23 NOTE — Telephone Encounter (Signed)
Patient called stating that Dr. Deri FuellingAluisio's office called telling her that they needed more information. She is aware you will give her a call after clinic today. She stated she would be home around 4 pm today if you can't reach her then, please call in the morning.

## 2018-07-25 NOTE — Telephone Encounter (Signed)
Called [facility] and all of patient information has been received. They are awaiting provider review/authorization before appointment is booked. Pt notified of all information and verbalized understanding.

## 2018-08-20 DIAGNOSIS — Z01419 Encounter for gynecological examination (general) (routine) without abnormal findings: Secondary | ICD-10-CM | POA: Diagnosis not present

## 2018-08-27 DIAGNOSIS — E1140 Type 2 diabetes mellitus with diabetic neuropathy, unspecified: Secondary | ICD-10-CM | POA: Diagnosis not present

## 2018-08-27 DIAGNOSIS — E1165 Type 2 diabetes mellitus with hyperglycemia: Secondary | ICD-10-CM | POA: Diagnosis not present

## 2018-08-27 DIAGNOSIS — E1122 Type 2 diabetes mellitus with diabetic chronic kidney disease: Secondary | ICD-10-CM | POA: Diagnosis not present

## 2018-08-30 DIAGNOSIS — E1165 Type 2 diabetes mellitus with hyperglycemia: Secondary | ICD-10-CM | POA: Diagnosis not present

## 2018-08-30 DIAGNOSIS — E782 Mixed hyperlipidemia: Secondary | ICD-10-CM | POA: Diagnosis not present

## 2018-08-30 DIAGNOSIS — I10 Essential (primary) hypertension: Secondary | ICD-10-CM | POA: Diagnosis not present

## 2018-09-24 DIAGNOSIS — M1711 Unilateral primary osteoarthritis, right knee: Secondary | ICD-10-CM | POA: Diagnosis not present

## 2018-09-24 DIAGNOSIS — M25561 Pain in right knee: Secondary | ICD-10-CM | POA: Diagnosis not present

## 2018-11-13 NOTE — H&P (Signed)
TOTAL KNEE ADMISSION H&P  Patient is being admitted for right total knee arthroplasty.  Subjective:  Chief Complaint:right knee pain.  HPI: Sarah Kent, 63 y.o. female, has a history of pain and functional disability in the right knee due to arthritis and has failed non-surgical conservative treatments for greater than 12 weeks to includecorticosteriod injections and activity modification.  Onset of symptoms was gradual, starting 5 years ago with gradually worsening course since that time. The patient noted prior procedures on the knee to include  arthroscopy on the right knee(s).  Patient currently rates pain in the right knee(s) at 7 out of 10 with activity. Patient has worsening of pain with activity and weight bearing, pain that interferes with activities of daily living and joint swelling.  Patient has evidence of bone-on-bone arthritis in the medial and patellofemoral compartments with significant varus deformity by imaging studies. There is no active infection.  Patient Active Problem List   Diagnosis Date Noted  . Essential hypertension 10/18/2017   No past medical history on file.  No past surgical history on file.  No current facility-administered medications for this encounter.    Current Outpatient Medications  Medication Sig Dispense Refill Last Dose  . aspirin 81 MG tablet Take 81 mg by mouth daily.   Taking  . HYDROcodone-acetaminophen (NORCO) 7.5-325 MG tablet Take 1 tablet by mouth every 6 (six) hours as needed for moderate pain (Must last 30 days). 120 tablet 0   . Liraglutide (VICTOZA) 18 MG/3ML SOPN Inject 1.2 mg into the skin once.   Taking  . lisinopril (PRINIVIL,ZESTRIL) 10 MG tablet Take 10 mg by mouth daily.   Taking  . metFORMIN (GLUCOPHAGE) 500 MG tablet Take 500 mg by mouth 2 (two) times daily with a meal.   Taking  . simvastatin (ZOCOR) 10 MG tablet Take 10 mg by mouth daily.   Taking   No Known Allergies  Social History   Tobacco Use  . Smoking status:  Never Smoker  . Smokeless tobacco: Never Used  Substance Use Topics  . Alcohol use: No    No family history on file.   Review of Systems  Constitutional: Negative for chills and fever.  HENT: Negative for congestion, sore throat and tinnitus.   Eyes: Negative for double vision, photophobia and pain.  Respiratory: Negative for cough, shortness of breath and wheezing.   Cardiovascular: Negative for chest pain, palpitations and orthopnea.  Gastrointestinal: Negative for heartburn, nausea and vomiting.  Genitourinary: Negative for dysuria, frequency and urgency.  Musculoskeletal: Positive for joint pain.  Neurological: Negative for dizziness, weakness and headaches.    Objective:  Physical Exam  Well nourished and well developed. General: Alert and oriented x3, cooperative and pleasant, no acute distress. Head: normocephalic, atraumatic, neck supple. Eyes: EOMI. Respiratory: breath sounds clear in all fields, no wheezing, rales, or rhonchi. Cardiovascular: Regular rate and rhythm, no murmurs, gallops or rubs.  Abdomen: non-tender to palpation and soft, normoactive bowel sounds.  Musculoskeletal: Left Knee Exam:  No effusion. No Swelling. Range of motion is 0-125 degrees.  No crepitus on range of motion of the knee.  No medial joint line tenderness No lateral joint line tenderness.  Stable knee.  Right Knee Exam:  Varus deformity.  No effusion. No Swelling. Range of motion is 10-115 degrees.  Marked crepitus on range of motion of the knee.  Positive medial greater than lateral joint line tenderness Stable knee.   Calves soft and nontender. Motor function intact in LE. Strength 5/5  LE bilaterally. Neuro: Distal pulses 2+. Sensation to light touch intact in LE.  Vital signs in last 24 hours: Blood pressure: 118/74 mmHg  Labs:   Estimated body mass index is 37.86 kg/m as calculated from the following:   Height as of 07/11/18: 5\' 2"  (1.575 m).   Weight as of  07/11/18: 93.9 kg.   Imaging Review Plain radiographs demonstrate severe degenerative joint disease of the right knee(s). The overall alignment issignificant varus. The bone quality appears to be adequate for age and reported activity level.      Assessment/Plan:  End stage arthritis, right knee   The patient history, physical examination, clinical judgment of the provider and imaging studies are consistent with end stage degenerative joint disease of the right knee(s) and total knee arthroplasty is deemed medically necessary. The treatment options including medical management, injection therapy arthroscopy and arthroplasty were discussed at length. The risks and benefits of total knee arthroplasty were presented and reviewed. The risks due to aseptic loosening, infection, stiffness, patella tracking problems, thromboembolic complications and other imponderables were discussed. The patient acknowledged the explanation, agreed to proceed with the plan and consent was signed. Patient is being admitted for inpatient treatment for surgery, pain control, PT, OT, prophylactic antibiotics, VTE prophylaxis, progressive ambulation and ADL's and discharge planning. The patient is planning to be discharged home.    Anticipated LOS equal to or greater than 2 midnights due to - Age 58 and older with one or more of the following:  - Obesity  - Expected need for hospital services (PT, OT, Nursing) required for safe  discharge  - Anticipated need for postoperative skilled nursing care or inpatient rehab  - Active co-morbidities: None OR   - Unanticipated findings during/Post Surgery: None  - Patient is a high risk of re-admission due to: None   Therapy Plans: outpatient therapy at [facility] in [place] Disposition: Home with husband Planned DVT Prophylaxis: aspirin 325mg  BID DME needed: walker PCP: Dr. Fara Chute TXA: IV Allergies: NKDA Anesthesia Concerns: nausea BMI: 39 Last HgbA1c: 6.6%   - Patient  was instructed on what medications to stop prior to surgery. - Follow-up visit in 2 weeks with Dr. [name] - Begin physical therapy following surgery - Pre-operative lab work as pre-surgical testing - Prescriptions will be provided in hospital at time of discharge  Arther Abbott, PA-C Orthopedic Surgery [facility] [place]

## 2018-11-14 DIAGNOSIS — M1711 Unilateral primary osteoarthritis, right knee: Secondary | ICD-10-CM | POA: Diagnosis not present

## 2018-11-15 DIAGNOSIS — M25561 Pain in right knee: Secondary | ICD-10-CM | POA: Diagnosis not present

## 2018-12-09 ENCOUNTER — Ambulatory Visit: Admit: 2018-12-09 | Payer: 59 | Admitting: Orthopedic Surgery

## 2018-12-09 SURGERY — ARTHROPLASTY, KNEE, TOTAL
Anesthesia: Choice | Laterality: Right

## 2018-12-26 DIAGNOSIS — E1140 Type 2 diabetes mellitus with diabetic neuropathy, unspecified: Secondary | ICD-10-CM | POA: Diagnosis not present

## 2018-12-26 DIAGNOSIS — E1165 Type 2 diabetes mellitus with hyperglycemia: Secondary | ICD-10-CM | POA: Diagnosis not present

## 2018-12-26 DIAGNOSIS — E1122 Type 2 diabetes mellitus with diabetic chronic kidney disease: Secondary | ICD-10-CM | POA: Diagnosis not present

## 2018-12-30 DIAGNOSIS — E1165 Type 2 diabetes mellitus with hyperglycemia: Secondary | ICD-10-CM | POA: Diagnosis not present

## 2018-12-30 DIAGNOSIS — I10 Essential (primary) hypertension: Secondary | ICD-10-CM | POA: Diagnosis not present

## 2018-12-30 DIAGNOSIS — E782 Mixed hyperlipidemia: Secondary | ICD-10-CM | POA: Diagnosis not present

## 2019-01-16 ENCOUNTER — Ambulatory Visit: Payer: 59 | Admitting: Orthopaedic Surgery

## 2019-07-30 ENCOUNTER — Encounter: Payer: Self-pay | Admitting: Student

## 2019-07-30 NOTE — H&P (Signed)
TOTAL KNEE ADMISSION H&P  Patient is being admitted for right total knee arthroplasty.  Subjective:  Chief Complaint:right knee pain.  HPI: Sarah Kent, 63 y.o. female, has a history of pain and functional disability in the right knee due to arthritis and has failed non-surgical conservative treatments for greater than 12 weeks to includecorticosteriod injections and activity modification.  Onset of symptoms was gradual, starting several years ago with gradually worsening course since that time. The patient noted prior procedures on the knee to include  arthroscopy and menisectomy on the right knee(s).  Patient currently rates pain in the right knee(s) at 10 out of 10 with activity. Patient has worsening of pain with activity and weight bearing, crepitus and joint swelling.  Patient has evidence of bone-on-bone arthritis in the medial and patellofemoral compartments of the right knee with significant varus deformity and significant osteophyte formation by imaging studies.  There is no active infection.  Patient Active Problem List   Diagnosis Date Noted  . Essential hypertension 10/18/2017   No past medical history on file.  No past surgical history on file.  No current facility-administered medications for this encounter.    Current Outpatient Medications  Medication Sig Dispense Refill Last Dose  . aspirin 81 MG tablet Take 81 mg by mouth daily.   Taking  . HYDROcodone-acetaminophen (NORCO) 7.5-325 MG tablet Take 1 tablet by mouth every 6 (six) hours as needed for moderate pain (Must last 30 days). 120 tablet 0   . Liraglutide (VICTOZA) 18 MG/3ML SOPN Inject 1.2 mg into the skin once.   Taking  . lisinopril (PRINIVIL,ZESTRIL) 10 MG tablet Take 10 mg by mouth daily.   Taking  . metFORMIN (GLUCOPHAGE) 500 MG tablet Take 500 mg by mouth 2 (two) times daily with a meal.   Taking  . simvastatin (ZOCOR) 10 MG tablet Take 10 mg by mouth daily.   Taking   No Known Allergies  Social History    Tobacco Use  . Smoking status: Never Smoker  . Smokeless tobacco: Never Used  Substance Use Topics  . Alcohol use: No    No family history on file.   Review of Systems  Constitutional: Negative for chills and fever.  HENT: Negative for congestion, sore throat and tinnitus.   Eyes: Negative for double vision, photophobia and pain.  Respiratory: Negative for cough, shortness of breath and wheezing.   Cardiovascular: Negative for chest pain, palpitations and orthopnea.  Gastrointestinal: Negative for heartburn, nausea and vomiting.  Genitourinary: Negative for dysuria, frequency and urgency.  Musculoskeletal: Positive for joint pain.  Neurological: Negative for dizziness, weakness and headaches.    Objective:  Physical Exam  Well nourished and well developed.  General: Alert and oriented x3, cooperative and pleasant, no acute distress.  Head: normocephalic, atraumatic, neck supple.  Eyes: EOMI.  Respiratory: breath sounds clear in all fields, no wheezing, rales, or rhonchi. Cardiovascular: Regular rate and rhythm, no murmurs, gallops or rubs.  Abdomen: non-tender to palpation and soft, normoactive bowel sounds. Musculoskeletal:  Right Knee Exam:  Varus deformity.  No effusion. No Swelling. Range of motion is 10-115 degrees.  Marked crepitus on range of motion of the knee.  Positive medial greater than lateral joint line tenderness Stable knee.  Calves soft and nontender. Motor function intact in LE. Strength 5/5 LE bilaterally. Neuro: Distal pulses 2+. Sensation to light touch intact in LE.  Vital signs in last 24 hours: Blood pressure: 132/80 mmHg Pulse: 88 bpm  Labs:   Estimated  body mass index is 37.86 kg/m as calculated from the following:   Height as of 07/11/18: 5\' 2"  (1.575 m).   Weight as of 07/11/18: 93.9 kg.   Imaging Review Plain radiographs demonstrate severe degenerative joint disease of the right knee(s). The overall alignment issignificant  varus. The bone quality appears to be adequate for age and reported activity level.   Assessment/Plan:  End stage arthritis, right knee   The patient history, physical examination, clinical judgment of the provider and imaging studies are consistent with end stage degenerative joint disease of the right knee(s) and total knee arthroplasty is deemed medically necessary. The treatment options including medical management, injection therapy arthroscopy and arthroplasty were discussed at length. The risks and benefits of total knee arthroplasty were presented and reviewed. The risks due to aseptic loosening, infection, stiffness, patella tracking problems, thromboembolic complications and other imponderables were discussed. The patient acknowledged the explanation, agreed to proceed with the plan and consent was signed. Patient is being admitted for inpatient treatment for surgery, pain control, PT, OT, prophylactic antibiotics, VTE prophylaxis, progressive ambulation and ADL's and discharge planning. The patient is planning to be discharged home.   Anticipated LOS equal to or greater than 2 midnights due to - Age 19 and older with one or more of the following:  - Obesity  - Expected need for hospital services (PT, OT, Nursing) required for safe  discharge  - Anticipated need for postoperative skilled nursing care or inpatient rehab  - Active co-morbidities: Diabetes OR   - Unanticipated findings during/Post Surgery: None  - Patient is a high risk of re-admission due to: None  Therapy Plans: Outpatient therapy at [facility] in [place] Disposition: Home with husband Planned DVT Prophylaxis: Aspirin 325 mg BID DME needed: None PCP: [place], [place] TXA: IV Allergies: NKDA Anesthesia Concerns: Nausea BMI: 37.8 Last HgbA1c: 7.2%  - Patient was instructed on what medications to stop prior to surgery. - Follow-up visit in 2 weeks with Dr. Fara Chute - Begin physical therapy following surgery -  Pre-operative lab work as pre-surgical testing - Prescriptions will be provided in hospital at time of discharge  Sarah Halt, PA-C Orthopedic Surgery [facility] [place]

## 2019-08-20 NOTE — Patient Instructions (Addendum)
DUE TO COVID-19 ONLY ONE VISITOR IS ALLOWED TO COME WITH YOU AND STAY IN THE WAITING ROOM ONLY DURING PRE OP AND PROCEDURE DAY OF SURGERY. THE 1 VISITOR MAY VISIT WITH YOU AFTER SURGERY IN YOUR PRIVATE ROOM DURING VISITING HOURS ONLY!  YOU NEED TO HAVE A COVID 19 TEST ON: 08/21/2019. THIS TEST MUST BE DONE BEFORE SURGERY, COME  [address], [place] , [phone].  [facility] ONCE YOUR COVID TEST IS COMPLETED, PLEASE BEGIN THE QUARANTINE INSTRUCTIONS AS OUTLINED IN YOUR HANDOUT.                Sarah SquiresKathy D Kent    Your procedure is scheduled on: 08/25/2019   [facility]    Report to admitting at 7:00 AM     Call this number if you have problems the morning of surgery [phone]    Remember: NO SOLID FOOD AFTER MIDNIGHT THE NIGHT PRIOR TO SURGERY. NOTHING BY MOUTH EXCEPT CLEAR LIQUIDS UNTIL . PLEASE FINISH G2 Gatorade DRINK PER SURGEON ORDER  WHICH NEEDS TO BE COMPLETED AT : 6:25 AM     CLEAR LIQUID DIET   Foods Allowed                                                                     Foods Excluded  Coffee and tea, regular and decaf                             liquids that you cannot  Plain Jell-O any favor except red or purple                                           see through such as: Fruit ices (not with fruit pulp)                                     milk, soups, orange juice  Iced Popsicles                                    All solid food Carbonated beverages, regular and diet                                    Cranberry, grape and apple juices Sports drinks like Gatorade Lightly seasoned clear broth or consume(fat free) Sugar, honey syrup  Sample Menu Breakfast                                Lunch                                     Supper Cranberry juice  Beef broth                            Chicken broth Jell-O                                     Grape juice                           Apple  juice Coffee or tea                        Jell-O                                      Popsicle                                                Coffee or tea                        Coffee or tea  _____________________________________________________________________         BRUSH YOUR TEETH MORNING OF SURGERY AND RINSE YOUR MOUTH OUT, NO CHEWING GUM CANDY OR MINTS.     Take these medicines the morning of surgery with A SIP OF WATER: hydrocodone If needed                                  How to Manage Your Diabetes  Before and After Surgery  Why is it important to control my blood sugar before and after surgery? . Improving blood sugar levels before and after surgery helps healing and can limit problems. . A way of improving blood sugar control is eating a healthy diet by: o  Eating less sugar and carbohydrates o  Increasing activity/exercise o  Talking with your doctor about reaching your blood sugar goals . High blood sugars (greater than 180 mg/dL) can raise your risk of infections and slow your recovery, so you will need to focus on controlling your diabetes during the weeks before surgery. . Make sure that the doctor who takes care of your diabetes knows about your planned surgery including the date and location.  How do I manage my blood sugar before surgery? . Check your blood sugar at least 4 times a day, starting 2 days before surgery, to make sure that the level is not too high or low. o Check your blood sugar the morning of your surgery when you wake up and every 2 hours until you get to the Short Stay unit. . If your blood sugar is less than 70 mg/dL, you will need to treat for low blood sugar: o Do not take insulin. o Treat a low blood sugar (less than 70 mg/dL) with  cup of clear juice (cranberry or apple), 4 glucose tablets, OR glucose gel. o Recheck blood sugar in 15 minutes after treatment (to make sure it is greater than 70 mg/dL). If your blood sugar is not greater  than 70 mg/dL on recheck, call [phone] for further instructions. Marland Kitchen  Report your blood sugar to the short stay nurse when you get to Short Stay.  . If you are admitted to the hospital after surgery: o Your blood sugar will be checked by the staff and you will probably be given insulin after surgery (instead of oral diabetes medicines) to make sure you have good blood sugar levels. o The goal for blood sugar control after surgery is 80-180 mg/dL.   WHAT DO I DO ABOUT MY DIABETES MEDICATION?  Marland Kitchen Do not take oral diabetes medicines (pills) the morning of surgery.      . The day before your surgery use your Victoza as usual.  . The day of surgery, do not take other diabetes injectables, including Byetta (exenatide), Bydureon (exenatide ER), Victoza (liraglutide), or Trulicity (dulaglutide).        Patient Signature:  Date:   Nurse Signature:  Date:   Reviewed and Endorsed by [facility] Patient Education Committee, August 2015     You may not have any metal on your body including hair pins and              piercings  Do not wear jewelry, make-up, lotions, powders or perfumes, deodorant             Do not wear nail polish on your fingernails.  Do not shave  48 hours prior to surgery.            Do not bring valuables to the hospital. [facility] IS NOT             RESPONSIBLE   FOR VALUABLES.  Contacts, dentures or bridgework may not be worn into surgery.  Leave suitcase in the car. After surgery it may be brought to your room.   _____________________________________________________________________   [facility] - Preparing for Surgery    Before surgery, you can play an important role.  Because skin is not sterile, your skin needs to be as free of germs as possible.  You can reduce the number of germs on your skin by washing with CHG (chlorahexidine gluconate) soap before surgery.  CHG is an antiseptic cleaner which kills germs and bonds with the skin to continue killing  germs even after washing. Please DO NOT use if you have an allergy to CHG or antibacterial soaps.  If your skin becomes reddened/irritated stop using the CHG and inform your nurse when you arrive at Short Stay. Do not shave (including legs and underarms) for at least 48 hours prior to the first CHG shower.  You may shave your face/neck. Please follow these instructions carefully:  1.  Shower with CHG Soap the night before surgery and the  morning of Surgery.  2.  If you choose to wash your hair, wash your hair first as usual with your  normal  shampoo.  3.  After you shampoo, rinse your hair and body thoroughly to remove the  shampoo.                           4.  Use CHG as you would any other liquid soap.  You can apply chg directly  to the skin and wash                       Gently with a scrungie or clean washcloth.  5.  Apply the CHG Soap to your body ONLY FROM THE NECK DOWN.   Do not use on face/  open                           Wound or open sores. Avoid contact with eyes, ears mouth and genitals (private parts).                       Wash face,  Genitals (private parts) with your normal soap.             6.  Wash thoroughly, paying special attention to the area where your surgery  will be performed.  7.  Thoroughly rinse your body with warm water from the neck down.  8.  DO NOT shower/wash with your normal soap after using and rinsing off  the CHG Soap.                9.  Pat yourself dry with a clean towel.            10.  Wear clean pajamas.            11.  Place clean sheets on your bed the night of your first shower and do not  sleep with pets. Day of Surgery : Do not apply any lotions/deodorants the morning of surgery.  Please wear clean clothes to the hospital/surgery center.  FAILURE TO FOLLOW THESE INSTRUCTIONS MAY RESULT IN THE CANCELLATION OF YOUR SURGERY PATIENT SIGNATURE_________________________________  NURSE  SIGNATURE__________________________________  ________________________________________________________________________    Rogelia Mire  An incentive spirometer is a tool that can help keep your lungs clear and active. This tool measures how well you are filling your lungs with each breath. Taking long deep breaths may help reverse or decrease the chance of developing breathing (pulmonary) problems (especially infection) following:  A long period of time when you are unable to move or be active. BEFORE THE PROCEDURE   If the spirometer includes an indicator to show your best effort, your nurse or respiratory therapist will set it to a desired goal.  If possible, sit up straight or lean slightly forward. Try not to slouch.  Hold the incentive spirometer in an upright position. INSTRUCTIONS FOR USE  1. Sit on the edge of your bed if possible, or sit up as far as you can in bed or on a chair. 2. Hold the incentive spirometer in an upright position. 3. Breathe out normally. 4. Place the mouthpiece in your mouth and seal your lips tightly around it. 5. Breathe in slowly and as deeply as possible, raising the piston or the ball toward the top of the column. 6. Hold your breath for 3-5 seconds or for as long as possible. Allow the piston or ball to fall to the bottom of the column. 7. Remove the mouthpiece from your mouth and breathe out normally. 8. Rest for a few seconds and repeat Steps 1 through 7 at least 10 times every 1-2 hours when you are awake. Take your time and take a few normal breaths between deep breaths. 9. The spirometer may include an indicator to show your best effort. Use the indicator as a goal to work toward during each repetition. 10. After each set of 10 deep breaths, practice coughing to be sure your lungs are clear. If you have an incision (the cut made at the time of surgery), support your incision when coughing by placing a pillow or rolled up towels firmly  against it. Once you are able to get out of bed, walk around  indoors and cough well. You may stop using the incentive spirometer when instructed by your caregiver.  RISKS AND COMPLICATIONS  Take your time so you do not get dizzy or light-headed.  If you are in pain, you may need to take or ask for pain medication before doing incentive spirometry. It is harder to take a deep breath if you are having pain. AFTER USE  Rest and breathe slowly and easily.  It can be helpful to keep track of a log of your progress. Your caregiver can provide you with a simple table to help with this. If you are using the spirometer at home, follow these instructions: SEEK MEDICAL CARE IF:   You are having difficultly using the spirometer.  You have trouble using the spirometer as often as instructed.  Your pain medication is not giving enough relief while using the spirometer.  You develop fever of 100.5 F (38.1 C) or higher. SEEK IMMEDIATE MEDICAL CARE IF:   You cough up bloody sputum that had not been present before.  You develop fever of 102 F (38.9 C) or greater.  You develop worsening pain at or near the incision site. MAKE SURE YOU:   Understand these instructions.  Will watch your condition.  Will get help right away if you are not doing well or get worse. Document Released: 01/08/2007 Document Revised: 11/20/2011 Document Reviewed: 03/11/2007 Exit[employer] Patient Information 2014 [employer], [place].   ________________________________________________________________________    WHAT IS A BLOOD TRANSFUSION? Blood Transfusion Information  A transfusion is the replacement of blood or some of its parts. Blood is made up of multiple cells which provide different functions.  Red blood cells carry oxygen and are used for blood loss replacement.  White blood cells fight against infection.  Platelets control bleeding.  Plasma helps clot blood.  Other blood products are available for  specialized needs, such as hemophilia or other clotting disorders. BEFORE THE TRANSFUSION  Who gives blood for transfusions?   Healthy volunteers who are fully evaluated to make sure their blood is safe. This is blood bank blood. Transfusion therapy is the safest it has ever been in the practice of medicine. Before blood is taken from a donor, a complete history is taken to make sure that person has no history of diseases nor engages in risky social behavior (examples are intravenous drug use or sexual activity with multiple partners). The donor's travel history is screened to minimize risk of transmitting infections, such as malaria. The donated blood is tested for signs of infectious diseases, such as HIV and hepatitis. The blood is then tested to be sure it is compatible with you in order to minimize the chance of a transfusion reaction. If you or a relative donates blood, this is often done in anticipation of surgery and is not appropriate for emergency situations. It takes many days to process the donated blood. RISKS AND COMPLICATIONS Although transfusion therapy is very safe and saves many lives, the main dangers of transfusion include:   Getting an infectious disease.  Developing a transfusion reaction. This is an allergic reaction to something in the blood you were given. Every precaution is taken to prevent this. The decision to have a blood transfusion has been considered carefully by your caregiver before blood is given. Blood is not given unless the benefits outweigh the risks. AFTER THE TRANSFUSION  Right after receiving a blood transfusion, you will usually feel much better and more energetic. This is especially true if your red blood cells have  gotten low (anemic). The transfusion raises the level of the red blood cells which carry oxygen, and this usually causes an energy increase.  The nurse administering the transfusion will monitor you carefully for complications. HOME CARE  INSTRUCTIONS  No special instructions are needed after a transfusion. You may find your energy is better. Speak with your caregiver about any limitations on activity for underlying diseases you may have. SEEK MEDICAL CARE IF:   Your condition is not improving after your transfusion.  You develop redness or irritation at the intravenous (IV) site. SEEK IMMEDIATE MEDICAL CARE IF:  Any of the following symptoms occur over the next 12 hours:  Shaking chills.  You have a temperature by mouth above 102 F (38.9 C), not controlled by medicine.  Chest, back, or muscle pain.  People around you feel you are not acting correctly or are confused.  Shortness of breath or difficulty breathing.  Dizziness and fainting.  You get a rash or develop hives.  You have a decrease in urine output.  Your urine turns a dark color or changes to pink, red, or brown. Any of the following symptoms occur over the next 10 days:  You have a temperature by mouth above 102 F (38.9 C), not controlled by medicine.  Shortness of breath.  Weakness after normal activity.  The white part of the eye turns yellow (jaundice).  You have a decrease in the amount of urine or are urinating less often.  Your urine turns a dark color or changes to pink, red, or brown. Document Released: 08/25/2000 Document Revised: 11/20/2011 Document Reviewed: 04/13/2008 [facility] Patient Information 2014 [place], [place].  _______________________________________________________________________

## 2019-08-21 ENCOUNTER — Other Ambulatory Visit: Payer: Self-pay

## 2019-08-21 ENCOUNTER — Encounter (HOSPITAL_COMMUNITY)
Admission: RE | Admit: 2019-08-21 | Discharge: 2019-08-21 | Disposition: A | Discharge status: Home / Self Care | Payer: 59 | Source: Ambulatory Visit | Attending: Orthopedic Surgery | Admitting: Orthopedic Surgery

## 2019-08-21 ENCOUNTER — Other Ambulatory Visit (HOSPITAL_COMMUNITY)
Admission: RE | Admit: 2019-08-21 | Discharge: 2019-08-21 | Disposition: A | Discharge status: Home / Self Care | Payer: 59 | Source: Ambulatory Visit | Attending: Orthopedic Surgery | Admitting: Orthopedic Surgery

## 2019-08-21 ENCOUNTER — Encounter (HOSPITAL_COMMUNITY): Payer: Self-pay

## 2019-08-21 DIAGNOSIS — Z01812 Encounter for preprocedural laboratory examination: Secondary | ICD-10-CM | POA: Insufficient documentation

## 2019-08-21 DIAGNOSIS — Z20828 Contact with and (suspected) exposure to other viral communicable diseases: Secondary | ICD-10-CM | POA: Insufficient documentation

## 2019-08-21 HISTORY — DX: Type 2 diabetes mellitus without complications: E11.9

## 2019-08-21 HISTORY — DX: Unspecified osteoarthritis, unspecified site: M19.90

## 2019-08-21 HISTORY — DX: Personal history of urinary calculi: Z87.442

## 2019-08-21 HISTORY — DX: Peptic ulcer, site unspecified, unspecified as acute or chronic, without hemorrhage or perforation: K27.9

## 2019-08-21 HISTORY — DX: Pneumonia, unspecified organism: J18.9

## 2019-08-21 LAB — ABO/RH: ABO/RH(D): O POS

## 2019-08-21 LAB — COMPREHENSIVE METABOLIC PANEL
ALT: 17 U/L (ref 0–44)
AST: 15 U/L (ref 15–41)
Albumin: 4 g/dL (ref 3.5–5.0)
Alkaline Phosphatase: 59 U/L (ref 38–126)
Anion gap: 8 (ref 5–15)
BUN: 16 mg/dL (ref 8–23)
CO2: 27 mmol/L (ref 22–32)
Calcium: 8.8 mg/dL — ABNORMAL LOW (ref 8.9–10.3)
Chloride: 105 mmol/L (ref 98–111)
Creatinine, Ser: 0.92 mg/dL (ref 0.44–1.00)
GFR calc Af Amer: >60 mL/min (ref >60)
GFR calc non Af Amer: >60 mL/min (ref >60)
Glucose, Bld: 153 mg/dL — ABNORMAL HIGH (ref 70–99)
Potassium: 4.3 mmol/L (ref 3.5–5.1)
Sodium: 140 mmol/L (ref 135–145)
Total Bilirubin: 0.5 mg/dL (ref 0.3–1.2)
Total Protein: 7.4 g/dL (ref 6.5–8.1)

## 2019-08-21 LAB — PROTIME-INR
INR: 0.9 (ref 0.8–1.2)
Prothrombin Time: 11.8 seconds (ref 11.4–15.2)

## 2019-08-21 LAB — CBC
HCT: 40.1 % (ref 36.0–46.0)
Hemoglobin: 12.8 g/dL (ref 12.0–15.0)
MCH: 28.7 pg (ref 26.0–34.0)
MCHC: 31.9 g/dL (ref 30.0–36.0)
MCV: 89.9 fL (ref 80.0–100.0)
Platelets: 383 10*3/uL (ref 150–400)
RBC: 4.46 MIL/uL (ref 3.87–5.11)
RDW: 13.8 % (ref 11.5–15.5)
WBC: 9.4 10*3/uL (ref 4.0–10.5)
nRBC: 0 % (ref 0.0–0.2)

## 2019-08-21 LAB — HEMOGLOBIN A1C
Hgb A1c MFr Bld: 6.9 % — ABNORMAL HIGH (ref 4.8–5.6)
Mean Plasma Glucose: 151.33 mg/dL

## 2019-08-21 LAB — GLUCOSE, CAPILLARY: Glucose-Capillary: 164 mg/dL — ABNORMAL HIGH (ref 70–99)

## 2019-08-21 LAB — APTT: aPTT: 25 seconds (ref 24–36)

## 2019-08-21 LAB — MRSA PCR SCREENING: MRSA by PCR: NEGATIVE

## 2019-08-21 NOTE — Progress Notes (Signed)
PCP - Consuello Masse Cardiologist -   Chest x-ray -  EKG -  Stress Test -  ECHO -  Cardiac Cath -   Sleep Study -  CPAP -   Fasting Blood Sugar - 142 Checks Blood Sugar __once  a  week  Blood Thinner Instructions:By Dr. Lovena Neighbours Aspirin Instructions: Last Dose:08/17/2019  Anesthesia review:   Patient denies shortness of breath, fever, cough and chest pain at PAT appointment   Patient verbalized understanding of instructions that were given to them at the PAT appointment. Patient was also instructed that they will need to review over the PAT instructions again at home before surgery.

## 2019-08-22 LAB — NOVEL CORONAVIRUS, NAA (HOSP ORDER, SEND-OUT TO REF LAB; TAT 18-24 HRS): SARS-CoV-2, NAA: NOT DETECTED

## 2019-08-22 NOTE — Progress Notes (Signed)
Called about time change for surgery. To arrive 0905 for 1135 surgery on 08/25/2019. ERAS drink at 0830.

## 2019-08-24 MED ORDER — BUPIVACAINE LIPOSOME 1.3 % IJ SUSP
20.0000 mL | Freq: Once | INTRAMUSCULAR | Status: DC
  Filled 2019-08-24: qty 20

## 2019-08-25 ENCOUNTER — Other Ambulatory Visit: Payer: Self-pay

## 2019-08-25 ENCOUNTER — Ambulatory Visit (HOSPITAL_COMMUNITY): Payer: 59 | Admitting: Certified Registered"

## 2019-08-25 ENCOUNTER — Encounter (HOSPITAL_COMMUNITY): Payer: Self-pay | Admitting: Orthopedic Surgery

## 2019-08-25 ENCOUNTER — Ambulatory Visit (HOSPITAL_COMMUNITY): Payer: 59 | Admitting: Physician Assistant

## 2019-08-25 ENCOUNTER — Ambulatory Visit (HOSPITAL_COMMUNITY)
Admission: RE | Admit: 2019-08-25 | Discharge: 2019-08-26 | Disposition: A | Discharge status: Home / Self Care | Payer: 59 | Attending: Orthopedic Surgery | Admitting: Orthopedic Surgery

## 2019-08-25 ENCOUNTER — Encounter (HOSPITAL_COMMUNITY)
Admission: RE | Disposition: A | Discharge status: Home / Self Care | Payer: Self-pay | Source: Home / Self Care | Attending: Orthopedic Surgery

## 2019-08-25 DIAGNOSIS — Z79899 Other long term (current) drug therapy: Secondary | ICD-10-CM | POA: Diagnosis not present

## 2019-08-25 DIAGNOSIS — M1710 Unilateral primary osteoarthritis, unspecified knee: Secondary | ICD-10-CM | POA: Diagnosis present

## 2019-08-25 DIAGNOSIS — E119 Type 2 diabetes mellitus without complications: Secondary | ICD-10-CM | POA: Insufficient documentation

## 2019-08-25 DIAGNOSIS — Z7982 Long term (current) use of aspirin: Secondary | ICD-10-CM | POA: Diagnosis not present

## 2019-08-25 DIAGNOSIS — I10 Essential (primary) hypertension: Secondary | ICD-10-CM | POA: Diagnosis not present

## 2019-08-25 DIAGNOSIS — M1711 Unilateral primary osteoarthritis, right knee: Secondary | ICD-10-CM | POA: Insufficient documentation

## 2019-08-25 DIAGNOSIS — Z7984 Long term (current) use of oral hypoglycemic drugs: Secondary | ICD-10-CM | POA: Diagnosis not present

## 2019-08-25 DIAGNOSIS — M179 Osteoarthritis of knee, unspecified: Secondary | ICD-10-CM | POA: Diagnosis present

## 2019-08-25 HISTORY — PX: TOTAL KNEE ARTHROPLASTY: SHX125

## 2019-08-25 LAB — GLUCOSE, CAPILLARY
Glucose-Capillary: 112 mg/dL — ABNORMAL HIGH (ref 70–99)
Glucose-Capillary: 95 mg/dL (ref 70–99)
Glucose-Capillary: 199 mg/dL — ABNORMAL HIGH (ref 70–99)
Glucose-Capillary: 232 mg/dL — ABNORMAL HIGH (ref 70–99)

## 2019-08-25 LAB — TYPE AND SCREEN
ABO/RH(D): O POS
Antibody Screen: NEGATIVE

## 2019-08-25 SURGERY — ARTHROPLASTY, KNEE, TOTAL
Anesthesia: Monitor Anesthesia Care | Site: Knee | Laterality: Right

## 2019-08-25 MED ORDER — SODIUM CHLORIDE 0.9 % IR SOLN
Status: DC | PRN
  Administered 2019-08-25: 1000 mL

## 2019-08-25 MED ORDER — ONDANSETRON HCL 4 MG/2ML IJ SOLN
INTRAMUSCULAR | Status: DC | PRN
  Administered 2019-08-25: 4 mg via INTRAVENOUS

## 2019-08-25 MED ORDER — FENTANYL CITRATE (PF) 100 MCG/2ML IJ SOLN
INTRAMUSCULAR | Status: AC
  Filled 2019-08-25: qty 2

## 2019-08-25 MED ORDER — CEFAZOLIN SODIUM-DEXTROSE 2-4 GM/100ML-% IV SOLN
2.0000 g | INTRAVENOUS | Status: AC
  Administered 2019-08-25: 2 g via INTRAVENOUS
  Filled 2019-08-25: qty 100

## 2019-08-25 MED ORDER — DEXAMETHASONE SODIUM PHOSPHATE 10 MG/ML IJ SOLN
INTRAMUSCULAR | Status: AC
  Filled 2019-08-25: qty 1

## 2019-08-25 MED ORDER — LACTATED RINGERS IV SOLN
INTRAVENOUS | Status: DC
  Administered 2019-08-25 (×2): via INTRAVENOUS

## 2019-08-25 MED ORDER — BUPIVACAINE IN DEXTROSE 0.75-8.25 % IT SOLN
INTRATHECAL | Status: DC | PRN
  Administered 2019-08-25: 1.6 mL via INTRATHECAL

## 2019-08-25 MED ORDER — CEFAZOLIN SODIUM-DEXTROSE 2-4 GM/100ML-% IV SOLN
2.0000 g | Freq: Four times a day (QID) | INTRAVENOUS | Status: AC
  Administered 2019-08-25 – 2019-08-26 (×2): 2 g via INTRAVENOUS
  Filled 2019-08-25 (×2): qty 100

## 2019-08-25 MED ORDER — FENTANYL CITRATE (PF) 100 MCG/2ML IJ SOLN
50.0000 ug | INTRAMUSCULAR | Status: DC
  Administered 2019-08-25: 50 ug via INTRAVENOUS
  Filled 2019-08-25: qty 2

## 2019-08-25 MED ORDER — METOCLOPRAMIDE HCL 5 MG/ML IJ SOLN: 5.0000 mg | Freq: Three times a day (TID) | INTRAMUSCULAR | Status: DC | PRN

## 2019-08-25 MED ORDER — SODIUM CHLORIDE 0.9 % IV SOLN
INTRAVENOUS | Status: DC
  Administered 2019-08-25: 18:00:00 via INTRAVENOUS

## 2019-08-25 MED ORDER — OXYCODONE HCL 5 MG PO TABS: 5.0000 mg | ORAL_TABLET | ORAL | Status: DC | PRN

## 2019-08-25 MED ORDER — ONDANSETRON HCL 4 MG/2ML IJ SOLN
4.0000 mg | Freq: Four times a day (QID) | INTRAMUSCULAR | Status: DC | PRN
  Administered 2019-08-25: 4 mg via INTRAVENOUS
  Filled 2019-08-25: qty 2

## 2019-08-25 MED ORDER — CHLORHEXIDINE GLUCONATE 4 % EX LIQD: 60.0000 mL | Freq: Once | CUTANEOUS | Status: DC

## 2019-08-25 MED ORDER — PROPOFOL 10 MG/ML IV BOLUS
INTRAVENOUS | Status: AC
  Filled 2019-08-25: qty 20

## 2019-08-25 MED ORDER — TRAMADOL HCL 50 MG PO TABS
50.0000 mg | ORAL_TABLET | Freq: Four times a day (QID) | ORAL | Status: DC | PRN
  Administered 2019-08-25 – 2019-08-26 (×3): 100 mg via ORAL
  Filled 2019-08-25 (×4): qty 2

## 2019-08-25 MED ORDER — METHOCARBAMOL 500 MG IVPB - SIMPLE MED
500.0000 mg | Freq: Four times a day (QID) | INTRAVENOUS | Status: DC | PRN
  Administered 2019-08-25: 500 mg via INTRAVENOUS
  Filled 2019-08-25: qty 50

## 2019-08-25 MED ORDER — PROPOFOL 500 MG/50ML IV EMUL
INTRAVENOUS | Status: DC | PRN
  Administered 2019-08-25: 125 ug/kg/min via INTRAVENOUS

## 2019-08-25 MED ORDER — OXYCODONE HCL 5 MG/5ML PO SOLN: 5.0000 mg | Freq: Once | ORAL | Status: DC | PRN

## 2019-08-25 MED ORDER — PHENYLEPHRINE 40 MCG/ML (10ML) SYRINGE FOR IV PUSH (FOR BLOOD PRESSURE SUPPORT)
PREFILLED_SYRINGE | INTRAVENOUS | Status: DC | PRN
  Administered 2019-08-25 (×2): 120 ug via INTRAVENOUS

## 2019-08-25 MED ORDER — ASPIRIN EC 325 MG PO TBEC
325.0000 mg | DELAYED_RELEASE_TABLET | Freq: Two times a day (BID) | ORAL | Status: DC
  Administered 2019-08-26: 09:00:00 325 mg via ORAL
  Filled 2019-08-25: qty 1

## 2019-08-25 MED ORDER — FENTANYL CITRATE (PF) 100 MCG/2ML IJ SOLN
25.0000 ug | INTRAMUSCULAR | Status: DC | PRN
  Administered 2019-08-25 (×3): 50 ug via INTRAVENOUS

## 2019-08-25 MED ORDER — ROPIVACAINE HCL 5 MG/ML IJ SOLN
INTRAMUSCULAR | Status: DC | PRN
  Administered 2019-08-25: 25 mL via PERINEURAL

## 2019-08-25 MED ORDER — SIMVASTATIN 20 MG PO TABS
10.0000 mg | ORAL_TABLET | Freq: Every day | ORAL | Status: DC
  Administered 2019-08-25: 10 mg via ORAL
  Filled 2019-08-25: qty 1

## 2019-08-25 MED ORDER — ACETAMINOPHEN 10 MG/ML IV SOLN
1000.0000 mg | Freq: Four times a day (QID) | INTRAVENOUS | Status: DC
  Administered 2019-08-25: 1000 mg via INTRAVENOUS
  Filled 2019-08-25: qty 100

## 2019-08-25 MED ORDER — MIDAZOLAM HCL 2 MG/2ML IJ SOLN
1.0000 mg | INTRAMUSCULAR | Status: DC
  Administered 2019-08-25: 1 mg via INTRAVENOUS
  Filled 2019-08-25: qty 2

## 2019-08-25 MED ORDER — ONDANSETRON HCL 4 MG PO TABS: 4.0000 mg | ORAL_TABLET | Freq: Four times a day (QID) | ORAL | Status: DC | PRN

## 2019-08-25 MED ORDER — STERILE WATER FOR IRRIGATION IR SOLN
Status: DC | PRN
  Administered 2019-08-25 (×2): 1000 mL

## 2019-08-25 MED ORDER — HYDROMORPHONE HCL 1 MG/ML IJ SOLN
0.5000 mg | INTRAMUSCULAR | Status: DC | PRN
  Administered 2019-08-25 (×2): 0.5 mg via INTRAVENOUS

## 2019-08-25 MED ORDER — DOCUSATE SODIUM 100 MG PO CAPS
100.0000 mg | ORAL_CAPSULE | Freq: Two times a day (BID) | ORAL | Status: DC
  Administered 2019-08-25 – 2019-08-26 (×2): 100 mg via ORAL
  Filled 2019-08-25 (×3): qty 1

## 2019-08-25 MED ORDER — DIPHENHYDRAMINE HCL 12.5 MG/5ML PO ELIX: 12.5000 mg | ORAL_SOLUTION | ORAL | Status: DC | PRN

## 2019-08-25 MED ORDER — POVIDONE-IODINE 10 % EX SWAB
2.0000 "application " | Freq: Once | CUTANEOUS | Status: AC
  Administered 2019-08-25: 2 via TOPICAL

## 2019-08-25 MED ORDER — TRANEXAMIC ACID-NACL 1000-0.7 MG/100ML-% IV SOLN
1000.0000 mg | INTRAVENOUS | Status: AC
  Administered 2019-08-25: 1000 mg via INTRAVENOUS
  Filled 2019-08-25: qty 100

## 2019-08-25 MED ORDER — BISACODYL 10 MG RE SUPP: 10.0000 mg | Freq: Every day | RECTAL | Status: DC | PRN

## 2019-08-25 MED ORDER — METOCLOPRAMIDE HCL 5 MG PO TABS: 5.0000 mg | ORAL_TABLET | Freq: Three times a day (TID) | ORAL | Status: DC | PRN

## 2019-08-25 MED ORDER — LIRAGLUTIDE 18 MG/3ML ~~LOC~~ SOPN: 1.2000 mg | PEN_INJECTOR | Freq: Every day | SUBCUTANEOUS | Status: DC

## 2019-08-25 MED ORDER — DEXAMETHASONE SODIUM PHOSPHATE 10 MG/ML IJ SOLN
10.0000 mg | Freq: Once | INTRAMUSCULAR | Status: AC
  Administered 2019-08-26: 10 mg via INTRAVENOUS
  Filled 2019-08-25: qty 1

## 2019-08-25 MED ORDER — BUPIVACAINE LIPOSOME 1.3 % IJ SUSP
INTRAMUSCULAR | Status: DC | PRN
  Administered 2019-08-25: 20 mL

## 2019-08-25 MED ORDER — MENTHOL 3 MG MT LOZG: 1.0000 | LOZENGE | OROMUCOSAL | Status: DC | PRN

## 2019-08-25 MED ORDER — POLYETHYLENE GLYCOL 3350 17 G PO PACK: 17.0000 g | PACK | Freq: Every day | ORAL | Status: DC | PRN

## 2019-08-25 MED ORDER — PROPOFOL 10 MG/ML IV BOLUS
INTRAVENOUS | Status: DC | PRN
  Administered 2019-08-25: 20 mg via INTRAVENOUS

## 2019-08-25 MED ORDER — FLEET ENEMA 7-19 GM/118ML RE ENEM: 1.0000 | ENEMA | Freq: Once | RECTAL | Status: DC | PRN

## 2019-08-25 MED ORDER — METHOCARBAMOL 500 MG PO TABS
500.0000 mg | ORAL_TABLET | Freq: Four times a day (QID) | ORAL | Status: DC | PRN
  Administered 2019-08-26: 03:00:00 500 mg via ORAL
  Filled 2019-08-25: qty 1

## 2019-08-25 MED ORDER — GABAPENTIN 300 MG PO CAPS
300.0000 mg | ORAL_CAPSULE | Freq: Three times a day (TID) | ORAL | Status: DC
  Administered 2019-08-25 – 2019-08-26 (×3): 300 mg via ORAL
  Filled 2019-08-25 (×3): qty 1

## 2019-08-25 MED ORDER — DEXAMETHASONE SODIUM PHOSPHATE 10 MG/ML IJ SOLN
8.0000 mg | Freq: Once | INTRAMUSCULAR | Status: AC
  Administered 2019-08-25: 8 mg via INTRAVENOUS

## 2019-08-25 MED ORDER — SODIUM CHLORIDE (PF) 0.9 % IJ SOLN
INTRAMUSCULAR | Status: AC
  Filled 2019-08-25: qty 10

## 2019-08-25 MED ORDER — ROPIVACAINE HCL 5 MG/ML IJ SOLN: INTRAMUSCULAR | Status: DC | PRN

## 2019-08-25 MED ORDER — HYDROMORPHONE HCL 1 MG/ML IJ SOLN
INTRAMUSCULAR | Status: AC
  Filled 2019-08-25: qty 1

## 2019-08-25 MED ORDER — METHOCARBAMOL 500 MG IVPB - SIMPLE MED
INTRAVENOUS | Status: AC
  Filled 2019-08-25: qty 50

## 2019-08-25 MED ORDER — ACETAMINOPHEN 500 MG PO TABS
1000.0000 mg | ORAL_TABLET | Freq: Four times a day (QID) | ORAL | Status: DC
  Administered 2019-08-25 – 2019-08-26 (×2): 1000 mg via ORAL
  Filled 2019-08-25 (×2): qty 2

## 2019-08-25 MED ORDER — LIRAGLUTIDE 18 MG/3ML ~~LOC~~ SOPN: 1.2000 mg | PEN_INJECTOR | Freq: Once | SUBCUTANEOUS | Status: DC

## 2019-08-25 MED ORDER — PHENYLEPHRINE 40 MCG/ML (10ML) SYRINGE FOR IV PUSH (FOR BLOOD PRESSURE SUPPORT)
PREFILLED_SYRINGE | INTRAVENOUS | Status: AC
  Filled 2019-08-25: qty 10

## 2019-08-25 MED ORDER — SODIUM CHLORIDE (PF) 0.9 % IJ SOLN
INTRAMUSCULAR | Status: DC | PRN
  Administered 2019-08-25: 60 mL via INTRAVENOUS

## 2019-08-25 MED ORDER — MORPHINE SULFATE (PF) 2 MG/ML IV SOLN: 1.0000 mg | INTRAVENOUS | Status: DC | PRN

## 2019-08-25 MED ORDER — PROPOFOL 500 MG/50ML IV EMUL
INTRAVENOUS | Status: AC
  Filled 2019-08-25: qty 50

## 2019-08-25 MED ORDER — ONDANSETRON HCL 4 MG/2ML IJ SOLN
INTRAMUSCULAR | Status: AC
  Filled 2019-08-25: qty 2

## 2019-08-25 MED ORDER — ONDANSETRON HCL 4 MG/2ML IJ SOLN: 4.0000 mg | Freq: Four times a day (QID) | INTRAMUSCULAR | Status: DC | PRN

## 2019-08-25 MED ORDER — SODIUM CHLORIDE (PF) 0.9 % IJ SOLN
INTRAMUSCULAR | Status: AC
  Filled 2019-08-25: qty 50

## 2019-08-25 MED ORDER — INSULIN ASPART 100 UNIT/ML ~~LOC~~ SOLN
0.0000 [IU] | Freq: Three times a day (TID) | SUBCUTANEOUS | Status: DC
  Administered 2019-08-25: 3 [IU] via SUBCUTANEOUS
  Administered 2019-08-26: 12:00:00 5 [IU] via SUBCUTANEOUS
  Administered 2019-08-26: 2 [IU] via SUBCUTANEOUS

## 2019-08-25 MED ORDER — PHENOL 1.4 % MT LIQD: 1.0000 | OROMUCOSAL | Status: DC | PRN

## 2019-08-25 MED ORDER — OXYCODONE HCL 5 MG PO TABS: 5.0000 mg | ORAL_TABLET | Freq: Once | ORAL | Status: DC | PRN

## 2019-08-25 SURGICAL SUPPLY — 61 items
ATTUNE PSFEM RTSZ4 NARCEM KNEE (Femur) ×2 IMPLANT
ATTUNE PSRP INSR SZ4 8 KNEE (Insert) ×2 IMPLANT
BAG SPEC THK2 15X12 ZIP CLS (MISCELLANEOUS) ×1
BAG ZIPLOCK 12X15 (MISCELLANEOUS) ×2 IMPLANT
BASE TIBIAL ROT PLAT SZ 3 KNEE (Knees) ×1 IMPLANT
BLADE SAG 18X100X1.27 (BLADE) ×2 IMPLANT
BLADE SAW SGTL 11.0X1.19X90.0M (BLADE) ×2 IMPLANT
BLADE SURG SZ10 CARB STEEL (BLADE) ×4 IMPLANT
BNDG ELASTIC 6X5.8 VLCR STR LF (GAUZE/BANDAGES/DRESSINGS) ×2 IMPLANT
BOWL SMART MIX CTS (DISPOSABLE) ×2 IMPLANT
CEMENT HV SMART SET (Cement) ×4 IMPLANT
CLSR STERI-STRIP ANTIMIC 1/2X4 (GAUZE/BANDAGES/DRESSINGS) ×2 IMPLANT
COVER SURGICAL LIGHT HANDLE (MISCELLANEOUS) ×2 IMPLANT
COVER WAND RF STERILE (DRAPES) IMPLANT
CUFF TOURN SGL QUICK 34 (TOURNIQUET CUFF) ×1
CUFF TRNQT CYL 34X4.125X (TOURNIQUET CUFF) ×1 IMPLANT
DECANTER SPIKE VIAL GLASS SM (MISCELLANEOUS) ×2 IMPLANT
DRAPE U-SHAPE 47X51 STRL (DRAPES) ×2 IMPLANT
DRSG ADAPTIC 3X8 NADH LF (GAUZE/BANDAGES/DRESSINGS) ×2 IMPLANT
DRSG AQUACEL AG ADV 3.5X10 (GAUZE/BANDAGES/DRESSINGS) ×2 IMPLANT
DRSG PAD ABDOMINAL 8X10 ST (GAUZE/BANDAGES/DRESSINGS) ×2 IMPLANT
DURAPREP 26ML APPLICATOR (WOUND CARE) ×2 IMPLANT
ELECT REM PT RETURN 15FT ADLT (MISCELLANEOUS) ×2 IMPLANT
EVACUATOR 1/8 PVC DRAIN (DRAIN) ×2 IMPLANT
GAUZE SPONGE 4X4 12PLY STRL (GAUZE/BANDAGES/DRESSINGS) ×2 IMPLANT
GLOVE BIO SURGEON STRL SZ7 (GLOVE) ×2 IMPLANT
GLOVE BIO SURGEON STRL SZ8 (GLOVE) ×2 IMPLANT
GLOVE BIOGEL PI IND STRL 6.5 (GLOVE) ×1 IMPLANT
GLOVE BIOGEL PI IND STRL 7.0 (GLOVE) ×1 IMPLANT
GLOVE BIOGEL PI IND STRL 8 (GLOVE) ×1 IMPLANT
GLOVE BIOGEL PI INDICATOR 6.5 (GLOVE) ×1
GLOVE BIOGEL PI INDICATOR 7.0 (GLOVE) ×1
GLOVE BIOGEL PI INDICATOR 8 (GLOVE) ×1
GLOVE SURG SS PI 6.5 STRL IVOR (GLOVE) ×2 IMPLANT
GOWN STRL REUS W/TWL LRG LVL3 (GOWN DISPOSABLE) ×6 IMPLANT
HANDPIECE INTERPULSE COAX TIP (DISPOSABLE) ×2
HOLDER FOLEY CATH W/STRAP (MISCELLANEOUS) IMPLANT
IMMOBILIZER KNEE 20 (SOFTGOODS) ×2
IMMOBILIZER KNEE 20 THIGH 36 (SOFTGOODS) ×1 IMPLANT
KIT TURNOVER KIT A (KITS) IMPLANT
MANIFOLD NEPTUNE II (INSTRUMENTS) ×2 IMPLANT
NS IRRIG 1000ML POUR BTL (IV SOLUTION) ×2 IMPLANT
PACK TOTAL KNEE CUSTOM (KITS) ×2 IMPLANT
PADDING CAST COTTON 6X4 STRL (CAST SUPPLIES) ×2 IMPLANT
PATELLA MEDIAL ATTUN 35MM KNEE (Knees) ×2 IMPLANT
PENCIL SMOKE EVACUATOR (MISCELLANEOUS) IMPLANT
PIN DRILL FIX HALF THREAD (BIT) ×2 IMPLANT
PIN STEINMAN FIXATION KNEE (PIN) ×2 IMPLANT
PROTECTOR NERVE ULNAR (MISCELLANEOUS) ×2 IMPLANT
SET HNDPC FAN SPRY TIP SCT (DISPOSABLE) ×1 IMPLANT
STRIP CLOSURE SKIN 1/2X4 (GAUZE/BANDAGES/DRESSINGS) ×4 IMPLANT
SUT MNCRL AB 4-0 PS2 18 (SUTURE) ×2 IMPLANT
SUT STRATAFIX 0 PDS 27 VIOLET (SUTURE) ×2
SUT VIC AB 2-0 CT1 27 (SUTURE) ×3
SUT VIC AB 2-0 CT1 TAPERPNT 27 (SUTURE) ×3 IMPLANT
SUTURE STRATFX 0 PDS 27 VIOLET (SUTURE) ×1 IMPLANT
TIBIAL BASE ROT PLAT SZ 3 KNEE (Knees) ×2 IMPLANT
TRAY FOLEY MTR SLVR 16FR STAT (SET/KITS/TRAYS/PACK) ×2 IMPLANT
WATER STERILE IRR 1000ML POUR (IV SOLUTION) ×4 IMPLANT
WRAP KNEE MAXI GEL POST OP (GAUZE/BANDAGES/DRESSINGS) ×2 IMPLANT
YANKAUER SUCT BULB TIP 10FT TU (MISCELLANEOUS) ×2 IMPLANT

## 2019-08-25 NOTE — Progress Notes (Signed)
Assisted Dr. Hodierne with right, ultrasound guided, adductor canal block. Side rails up, monitors on throughout procedure. See vital signs in flow sheet. Tolerated Procedure well.  

## 2019-08-25 NOTE — Anesthesia Preprocedure Evaluation (Signed)
Anesthesia Evaluation  Patient identified by MRN, date of birth, ID band Patient awake    Reviewed: Allergy & Precautions, H&P , NPO status , Patient's Chart, lab work & pertinent test results  Airway Mallampati: II   Neck ROM: full    Dental   Pulmonary neg pulmonary ROS,    breath sounds clear to auscultation       Cardiovascular hypertension,  Rhythm:regular Rate:Normal     Neuro/Psych    GI/Hepatic PUD,   Endo/Other  diabetes, Type 2obese  Renal/GU      Musculoskeletal  (+) Arthritis ,   Abdominal   Peds  Hematology   Anesthesia Other Findings   Reproductive/Obstetrics                             Anesthesia Physical Anesthesia Plan  ASA: II  Anesthesia Plan: Spinal and MAC   Post-op Pain Management:  Regional for Post-op pain   Induction: Intravenous  PONV Risk Score and Plan: 2 and Ondansetron, Dexamethasone, Propofol infusion, Midazolam and Treatment may vary due to age or medical condition  Airway Management Planned: Simple Face Mask  Additional Equipment:   Intra-op Plan:   Post-operative Plan:   Informed Consent: I have reviewed the patients History and Physical, chart, labs and discussed the procedure including the risks, benefits and alternatives for the proposed anesthesia with the patient or authorized representative who has indicated his/her understanding and acceptance.       Plan Discussed with: CRNA, Anesthesiologist and Surgeon  Anesthesia Plan Comments:         Anesthesia Quick Evaluation

## 2019-08-25 NOTE — Transfer of Care (Signed)
Immediate Anesthesia Transfer of Care Note  Patient: Sarah Kent  Procedure(s) Performed: TOTAL KNEE ARTHROPLASTY (Right Knee)  Patient Location: PACU  Anesthesia Type:Spinal  Level of Consciousness: awake, alert  and oriented  Airway & Oxygen Therapy: Patient Spontanous Breathing and Patient connected to face mask oxygen  Post-op Assessment: Report given to RN and Post -op Vital signs reviewed and stable  Post vital signs: Reviewed and stable  Last Vitals:  Vitals Value Taken Time  BP    Temp    Pulse 77 08/25/19 1341  Resp 18 08/25/19 1341  SpO2 100 % 08/25/19 1341  Vitals shown include unvalidated device data.  Last Pain:  Vitals:   08/25/19 1131  TempSrc:   PainSc: 0-No pain      Patients Stated Pain Goal: 4 ([id number])  Complications: No apparent anesthesia complications

## 2019-08-25 NOTE — Anesthesia Procedure Notes (Signed)
Procedure Name: MAC Date/Time: 08/25/2019 12:00 PM Performed by: Niel Hummer, CRNA Pre-anesthesia Checklist: Patient identified, Emergency Drugs available, Suction available and Patient being monitored Patient Re-evaluated:Patient Re-evaluated prior to induction Oxygen Delivery Method: Simple face mask

## 2019-08-25 NOTE — Op Note (Signed)
OPERATIVE REPORT-TOTAL KNEE ARTHROPLASTY   Pre-operative diagnosis- Osteoarthritis  Right knee(s)  Post-operative diagnosis- Osteoarthritis Right knee(s)  Procedure-  Right  Total Knee Arthroplasty (Depuy Attune)  Surgeon- Dione Plover. Lamari Beckles, MD  Assistant- Ardeen Jourdain, PA-C   Anesthesia-  Adductor canal block and spinal  EBL-100 mL   Drains Hemovac  Tourniquet time-  Total Tourniquet Time Documented: Thigh (Right) - 41 minutes Total: Thigh (Right) - 41 minutes     Complications- None  Condition-PACU - hemodynamically stable.   Brief Clinical Note  Sarah Kent is a 63 y.o. year old female with end stage OA of her right knee with progressively worsening pain and dysfunction. She has constant pain, with activity and at rest and significant functional deficits with difficulties even with ADLs. She has had extensive non-op management including analgesics, injections of cortisone and viscosupplements, and home exercise program, but remains in significant pain with significant dysfunction.Radiographs show bone on bone arthritis medial and patellofemoral with large global osteophytes.. She presents now for right Total Knee Arthroplasty.    Procedure in detail---   The patient is brought into the operating room and positioned supine on the operating table. After successful administration of  Adductor canal block and spinal,   a tourniquet is placed high on the  Right thigh(s) and the lower extremity is prepped and draped in the usual sterile fashion. Time out is performed by the operating team and then the  Right lower extremity is wrapped in Esmarch, knee flexed and the tourniquet inflated to 300 mmHg.       A midline incision is made with a ten blade through the subcutaneous tissue to the level of the extensor mechanism. A fresh blade is used to make a medial parapatellar arthrotomy. Soft tissue over the proximal medial tibia is subperiosteally elevated to the joint line with a  knife and into the semimembranosus bursa with a Cobb elevator. Soft tissue over the proximal lateral tibia is elevated with attention being paid to avoiding the patellar tendon on the tibial tubercle. The patella is everted, knee flexed 90 degrees and the ACL and PCL are removed. Findings are bone on bone medial and patellofemoral with large global osteophytes.        The drill is used to create a starting hole in the distal femur and the canal is thoroughly irrigated with sterile saline to remove the fatty contents. The 5 degree Right  valgus alignment guide is placed into the femoral canal and the distal femoral cutting block is pinned to remove 9 mm off the distal femur. Resection is made with an oscillating saw.      The tibia is subluxed forward and the menisci are removed. The extramedullary alignment guide is placed referencing proximally at the medial aspect of the tibial tubercle and distally along the second metatarsal axis and tibial crest. The block is pinned to remove 54mm off the more deficient medial  side. Resection is made with an oscillating saw. Size 3is the most appropriate size for the tibia and the proximal tibia is prepared with the modular drill and keel punch for that size.      The femoral sizing guide is placed and size 4 is most appropriate. Rotation is marked off the epicondylar axis and confirmed by creating a rectangular flexion gap at 90 degrees. The size 4 cutting block is pinned in this rotation and the anterior, posterior and chamfer cuts are made with the oscillating saw. The intercondylar block is then placed and  that cut is made.      Trial size 3 tibial component, trial size 4 posterior stabilized femur and a 8  mm posterior stabilized rotating platform insert trial is placed. Full extension is achieved with excellent varus/valgus and anterior/posterior balance throughout full range of motion. The patella is everted and thickness measured to be 21  mm. Free hand resection is  taken to 12 mm, a 35 template is placed, lug holes are drilled, trial patella is placed, and it tracks normally. Osteophytes are removed off the posterior femur with the trial in place. All trials are removed and the cut bone surfaces prepared with pulsatile lavage. Cement is mixed and once ready for implantation, the size 3 tibial implant, size  4 posterior stabilized femoral component, and the size 35 patella are cemented in place and the patella is held with the clamp. The trial insert is placed and the knee held in full extension. The Exparel (20 ml mixed with 60 ml saline) is injected into the extensor mechanism, posterior capsule, medial and lateral gutters and subcutaneous tissues.  All extruded cement is removed and once the cement is hard the permanent 8 mm posterior stabilized rotating platform insert is placed into the tibial tray.      The wound is copiously irrigated with saline solution and the extensor mechanism closed over a hemovac drain with #1 V-loc suture. The tourniquet is released for a total tourniquet time of 41  minutes. Flexion against gravity is 140 degrees and the patella tracks normally. Subcutaneous tissue is closed with 2.0 vicryl and subcuticular with running 4.0 Monocryl. The incision is cleaned and dried and steri-strips and a bulky sterile dressing are applied. The limb is placed into a knee immobilizer and the patient is awakened and transported to recovery in stable condition.      Please note that a surgical assistant was a medical necessity for this procedure in order to perform it in a safe and expeditious manner. Surgical assistant was necessary to retract the ligaments and vital neurovascular structures to prevent injury to them and also necessary for proper positioning of the limb to allow for anatomic placement of the prosthesis.   Gus Rankin Gio Janoski, MD    08/25/2019, 1:18 PM

## 2019-08-25 NOTE — Evaluation (Signed)
Physical Therapy Evaluation Patient Details Name: Sarah Kent MRN: [id number] DOB: 1956-07-02 Today's Date: 08/25/2019   History of Present Illness  Patient is 63 y.o. female s/p Rt TKA on 08/25/19 with PMH significant for DM and OA.  Clinical Impression  Sarah Kent is a 63 y.o. female POD 0 s/p Rt TKA. Patient reports independence with mobility at baseline. Patient is now limited by functional impairments (see PT problem list below) and requires min assist for transfers and gait with RW. Patient was able to ambulate ~30 feet with RW and min assist to manage walker safely. Patient instructed in exercise to facilitate ROM and circulation. Patient will benefit from continued skilled PT interventions to address impairments and progress towards PLOF. Acute PT will follow to progress mobility and stair training in preparation for safe discharge home.    Follow Up Recommendations Follow surgeon's recommendation for DC plan and follow-up therapies    Equipment Recommendations  Rolling walker with 5" wheels(youth/short)    Recommendations for Other Services       Precautions / Restrictions Precautions Precautions: Fall Restrictions Weight Bearing Restrictions: No      Mobility  Bed Mobility Overal bed mobility: Needs Assistance Bed Mobility: Supine to Sit     Supine to sit: HOB elevated;Min assist     General bed mobility comments: cues for use of bed rail and assist to bring LE's to EOB and raist trunk.  Transfers Overall transfer level: Needs assistance Equipment used: Rolling walker (2 wheeled) Transfers: Sit to/from Stand Sit to Stand: Min assist         General transfer comment: cues for hand placement and technique with RW, assist to initiate power up and steady with rising  Ambulation/Gait Ambulation/Gait assistance: Min assist Gait Distance (Feet): 30 Feet Assistive device: Rolling walker (2 wheeled) Gait Pattern/deviations: Step-to pattern;Decreased  stride length;Decreased step length - left;Decreased stance time - right;Antalgic Gait velocity: slow   General Gait Details: verbal cues for step pattern and to remain in close proximity to RW, assist to manage walker placement throughout, no overt LOB noted.  Stairs            Wheelchair Mobility    Modified Rankin (Stroke Patients Only)       Balance Overall balance assessment: Needs assistance Sitting-balance support: Feet supported Sitting balance-Leahy Scale: Good     Standing balance support: During functional activity;Bilateral upper extremity supported Standing balance-Leahy Scale: Poor                Pertinent Vitals/Pain Pain Assessment: 0-10 Pain Score: 3  Pain Location: Rt knee Pain Descriptors / Indicators: Aching;Sore Pain Intervention(s): Limited activity within patient's tolerance;Monitored during session;Repositioned;Ice applied    Home Living Family/patient expects to be discharged to:: Private residence Living Arrangements: Spouse/significant other Available Help at Discharge: Family Type of Home: House Home Access: Stairs to enter Entrance Stairs-Rails: None Entrance Stairs-Number of Steps: 1 step through carport Home Layout: One level Home Equipment: Environmental consultant - 4 wheels;Crutches      Prior Function Level of Independence: Independent               Hand Dominance   Dominant Hand: Right    Extremity/Trunk Assessment   Upper Extremity Assessment Upper Extremity Assessment: Overall WFL for tasks assessed    Lower Extremity Assessment Lower Extremity Assessment: Generalized weakness;RLE deficits/detail RLE Deficits / Details: no extensor lag noted with SLR RLE Sensation: WNL RLE Coordination: WNL    Cervical / Trunk Assessment Cervical /  Trunk Assessment: Normal  Communication   Communication: No difficulties  Cognition Arousal/Alertness: Awake/alert Behavior During Therapy: WFL for tasks assessed/performed Overall  Cognitive Status: Within Functional Limits for tasks assessed           General Comments      Exercises Total Joint Exercises Ankle Circles/Pumps: AROM;10 reps;Seated;Both Quad Sets: AROM;10 reps;Seated;Right Heel Slides: AAROM;10 reps;Seated;Right   Assessment/Plan    PT Assessment Patient needs continued PT services  PT Problem List Decreased balance;Decreased strength;Decreased range of motion;Decreased mobility;Decreased knowledge of use of DME;Decreased activity tolerance       PT Treatment Interventions DME instruction;Functional mobility training;Balance training;Patient/family education;Therapeutic activities;Gait training;Stair training;Therapeutic exercise    PT Goals (Current goals can be found in the Care Plan section)  Acute Rehab PT Goals Patient Stated Goal: return home and get back to independence PT Goal Formulation: With patient Time For Goal Achievement: 09/01/19 Potential to Achieve Goals: Good    Frequency 7X/week    AM-PAC PT "6 Clicks" Mobility  Outcome Measure Help needed turning from your back to your side while in a flat bed without using bedrails?: A Little Help needed moving from lying on your back to sitting on the side of a flat bed without using bedrails?: A Little Help needed moving to and from a bed to a chair (including a wheelchair)?: A Little Help needed standing up from a chair using your arms (e.g., wheelchair or bedside chair)?: A Little Help needed to walk in hospital room?: A Little Help needed climbing 3-5 steps with a railing? : A Little 6 Click Score: 18    End of Session Equipment Utilized During Treatment: Gait belt Activity Tolerance: Patient tolerated treatment well Patient left: with call bell/phone within reach;in chair;with family/visitor present;with chair alarm set Nurse Communication: Mobility status PT Visit Diagnosis: Muscle weakness (generalized) (M62.81);Difficulty in walking, not elsewhere classified (R26.2)     Time: 0071-2197 PT Time Calculation (min) (ACUTE ONLY): 25 min   Charges:   PT Evaluation $PT Eval Low Complexity: 1 Low PT Treatments $Therapeutic Exercise: 8-22 mins        Gwynneth Albright PT, DPT Physical Therapist with [facility]  08/25/2019 7:19 PM

## 2019-08-25 NOTE — Anesthesia Procedure Notes (Signed)
Spinal  Patient location during procedure: OR Start time: 08/25/2019 12:01 PM End time: 08/25/2019 12:05 PM Staffing Performed: resident/CRNA  Resident/CRNA: Niel Hummer, CRNA Preanesthetic Checklist Completed: patient identified, IV checked, risks and benefits discussed, surgical consent and monitors and equipment checked Spinal Block Patient position: sitting Prep: DuraPrep Patient monitoring: heart rate, continuous pulse ox and blood pressure Approach: midline Location: L3-4 Injection technique: single-shot Needle Needle type: Pencan  Needle gauge: 24 G Needle length: 9 cm

## 2019-08-25 NOTE — Discharge Instructions (Signed)
° °Dr. Frank Aluisio °Total Joint Specialist °[facility] °[address] °[place], [place] [address] °[phone] ° °TOTAL KNEE REPLACEMENT POSTOPERATIVE DIRECTIONS ° °Knee Rehabilitation, Guidelines Following Surgery  °Results after knee surgery are often greatly improved when you follow the exercise, range of motion and muscle strengthening exercises prescribed by your doctor. Safety measures are also important to protect the knee from further injury. Any time any of these exercises cause you to have increased pain or swelling in your knee joint, decrease the amount until you are comfortable again and slowly increase them. If you have problems or questions, call your caregiver or physical therapist for advice.  ° °HOME CARE INSTRUCTIONS  °• Remove items at home which could result in a fall. This includes throw rugs or furniture in walking pathways.  °· ICE to the affected knee every three hours for 30 minutes at a time and then as needed for pain and swelling.  Continue to use ice on the knee for pain and swelling from surgery. You may notice swelling that will progress down to the foot and ankle.  This is normal after surgery.  Elevate the leg when you are not up walking on it.   °· Continue to use the breathing machine which will help keep your temperature down.  It is common for your temperature to cycle up and down following surgery, especially at night when you are not up moving around and exerting yourself.  The breathing machine keeps your lungs expanded and your temperature down. °· Do not place pillow under knee, focus on keeping the knee straight while resting ° °DIET °You may resume your previous home diet once your are discharged from the hospital. ° °DRESSING / WOUND CARE / SHOWERING °You may change your dressing 3-5 days after surgery.  Then change the dressing every day with sterile gauze.  Please use good hand washing techniques before changing the dressing.  Do not use any lotions  or creams on the incision until instructed by your surgeon. °You may start showering once you are discharged home but do not submerge the incision under water. Just pat the incision dry and apply a dry gauze dressing on daily. °Change the surgical dressing daily and reapply a dry dressing each time. ° °ACTIVITY °Walk with your walker as instructed. °Use walker as long as suggested by your caregivers. °Avoid periods of inactivity such as sitting longer than an hour when not asleep. This helps prevent blood clots.  °You may resume a sexual relationship in one month or when given the OK by your doctor.  °You may return to work once you are cleared by your doctor.  °Do not drive a car for 6 weeks or until released by you surgeon.  °Do not drive while taking narcotics. ° °WEIGHT BEARING °Weight bearing as tolerated with assist device (walker, cane, etc) as directed, use it as long as suggested by your surgeon or therapist, typically at least 4-6 weeks. ° °POSTOPERATIVE CONSTIPATION PROTOCOL °Constipation - defined medically as fewer than three stools per week and severe constipation as less than one stool per week. ° °One of the most common issues patients have following surgery is constipation.  Even if you have a regular bowel pattern at home, your normal regimen is likely to be disrupted due to multiple reasons following surgery.  Combination of anesthesia, postoperative narcotics, change in appetite and fluid intake all can affect your bowels.  In order to avoid complications following surgery, here are some   recommendations in order to help you during your recovery period. ° °Colace (docusate) - Pick up an over-the-counter form of Colace or another stool softener and take twice a day as long as you are requiring postoperative pain medications.  Take with a full glass of water daily.  If you experience loose stools or diarrhea, hold the colace until you stool forms back up.  If your symptoms do not get better within 1  week or if they get worse, check with your doctor. ° °Dulcolax (bisacodyl) - Pick up over-the-counter and take as directed by the product packaging as needed to assist with the movement of your bowels.  Take with a full glass of water.  Use this product as needed if not relieved by Colace only.  ° °MiraLax (polyethylene glycol) - Pick up over-the-counter to have on hand.  MiraLax is a solution that will increase the amount of water in your bowels to assist with bowel movements.  Take as directed and can mix with a glass of water, juice, soda, coffee, or tea.  Take if you go more than two days without a movement. °Do not use MiraLax more than once per day. Call your doctor if you are still constipated or irregular after using this medication for 7 days in a row. ° °If you continue to have problems with postoperative constipation, please contact the office for further assistance and recommendations.  If you experience "the worst abdominal pain ever" or develop nausea or vomiting, please contact the office immediatly for further recommendations for treatment. ° °ITCHING °If you experience itching with your medications, try taking only a single pain pill, or even half a pain pill at a time.  You can also use Benadryl over the counter for itching or also to help with sleep.  ° °TED HOSE STOCKINGS °Wear the elastic stockings on both legs for three weeks following surgery during the day but you may remove then at night for sleeping. ° °MEDICATIONS °See your medication summary on the “After Visit Summary” that the nursing staff will review with you prior to discharge.  You may have some home medications which will be placed on hold until you complete the course of blood thinner medication.  It is important for you to complete the blood thinner medication as prescribed by your surgeon.  Continue your approved medications as instructed at time of discharge. ° °PRECAUTIONS °If you experience chest pain or shortness of breath -  call 911 immediately for transfer to the hospital emergency department.  °If you develop a fever greater that 101 F, purulent drainage from wound, increased redness or drainage from wound, foul odor from the wound/dressing, or calf pain - CONTACT YOUR SURGEON.   °                                                °FOLLOW-UP APPOINTMENTS °Make sure you keep all of your appointments after your operation with your surgeon and caregivers. You should call the office at the above phone number and make an appointment for approximately two weeks after the date of your surgery or on the date instructed by your surgeon outlined in the "After Visit Summary". ° °RANGE OF MOTION AND STRENGTHENING EXERCISES  °Rehabilitation of the knee is important following a knee injury or an operation. After just a few days of immobilization, the muscles of   the thigh which control the knee become weakened and shrink (atrophy). Knee exercises are designed to build up the tone and strength of the thigh muscles and to improve knee motion. Often times heat used for twenty to thirty minutes before working out will loosen up your tissues and help with improving the range of motion but do not use heat for the first two weeks following surgery. These exercises can be done on a training (exercise) mat, on the floor, on a table or on a bed. Use what ever works the best and is most comfortable for you Knee exercises include:  °• Leg Lifts - While your knee is still immobilized in a splint or cast, you can do straight leg raises. Lift the leg to 60 degrees, hold for 3 sec, and slowly lower the leg. Repeat 10-20 times 2-3 times daily. Perform this exercise against resistance later as your knee gets better.  °• Quad and Hamstring Sets - Tighten up the muscle on the front of the thigh (Quad) and hold for 5-10 sec. Repeat this 10-20 times hourly. Hamstring sets are done by pushing the foot backward against an object and holding for 5-10 sec. Repeat as with quad  sets.  °· Leg Slides: Lying on your back, slowly slide your foot toward your buttocks, bending your knee up off the floor (only go as far as is comfortable). Then slowly slide your foot back down until your leg is flat on the floor again. °· Angel Wings: Lying on your back spread your legs to the side as far apart as you can without causing discomfort.  °A rehabilitation program following serious knee injuries can speed recovery and prevent re-injury in the future due to weakened muscles. Contact your doctor or a physical therapist for more information on knee rehabilitation.  ° °IF YOU ARE TRANSFERRED TO A SKILLED REHAB FACILITY °If the patient is transferred to a skilled rehab facility following release from the hospital, a list of the current medications will be sent to the facility for the patient to continue.  When discharged from the skilled rehab facility, please have the facility set up the patient's Home Health Physical Therapy prior to being released. Also, the skilled facility will be responsible for providing the patient with their medications at time of release from the facility to include their pain medication, the muscle relaxants, and their blood thinner medication. If the patient is still at the rehab facility at time of the two week follow up appointment, the skilled rehab facility will also need to assist the patient in arranging follow up appointment in our office and any transportation needs. ° °MAKE SURE YOU:  °• Understand these instructions.  °• Get help right away if you are not doing well or get worse.  ° ° °Pick up stool softner and laxative for home use following surgery while on pain medications. °Do not submerge incision under water. °Please use good hand washing techniques while changing dressing each day. °May shower starting three days after surgery. °Please use a clean towel to pat the incision dry following showers. °Continue to use ice for pain and swelling after surgery. °Do not  use any lotions or creams on the incision until instructed by your surgeon. ° °

## 2019-08-25 NOTE — Anesthesia Procedure Notes (Signed)
Anesthesia Regional Block: Adductor canal block   Pre-Anesthetic Checklist: ,, timeout performed, Correct Patient, Correct Site, Correct Laterality, Correct Procedure, Correct Position, site marked, Risks and benefits discussed,  Surgical consent,  Pre-op evaluation,  At surgeon's request and post-op pain management  Laterality: Right  Prep: chloraprep       Needles:  Injection technique: Single-shot  Needle Type: Echogenic Needle     Needle Length: 9cm  Needle Gauge: 21     Additional Needles:   Narrative:  Start time: 08/25/2019 11:22 AM End time: 08/25/2019 11:27 AM Injection made incrementally with aspirations every 5 mL.  Performed by: Personally  Anesthesiologist: Albertha Ghee, MD  Additional Notes: Pt tolerated the procedure well.

## 2019-08-25 NOTE — Interval H&P Note (Signed)
History and Physical Interval Note:  08/25/2019 9:53 AM  Sarah Kent  has presented today for surgery, with the diagnosis of right knee osteoarthritis.  The various methods of treatment have been discussed with the patient and family. After consideration of risks, benefits and other options for treatment, the patient has consented to  Procedure(s) with comments: TOTAL KNEE ARTHROPLASTY (Right) - 63min as a surgical intervention.  The patient's history has been reviewed, patient examined, no change in status, stable for surgery.  I have reviewed the patient's chart and labs.  Questions were answered to the patient's satisfaction.     Pilar Plate Rosland Riding

## 2019-08-26 ENCOUNTER — Encounter: Payer: Self-pay | Admitting: *Deleted

## 2019-08-26 DIAGNOSIS — M1711 Unilateral primary osteoarthritis, right knee: Secondary | ICD-10-CM | POA: Diagnosis not present

## 2019-08-26 LAB — BASIC METABOLIC PANEL
Anion gap: 8 (ref 5–15)
BUN: 13 mg/dL (ref 8–23)
CO2: 24 mmol/L (ref 22–32)
Calcium: 8.4 mg/dL — ABNORMAL LOW (ref 8.9–10.3)
Chloride: 103 mmol/L (ref 98–111)
Creatinine, Ser: 0.75 mg/dL (ref 0.44–1.00)
GFR calc Af Amer: >60 mL/min (ref >60)
GFR calc non Af Amer: >60 mL/min (ref >60)
Glucose, Bld: 211 mg/dL — ABNORMAL HIGH (ref 70–99)
Potassium: 4.8 mmol/L (ref 3.5–5.1)
Sodium: 135 mmol/L (ref 135–145)

## 2019-08-26 LAB — CBC
HCT: 36.7 % (ref 36.0–46.0)
Hemoglobin: 11.7 g/dL — ABNORMAL LOW (ref 12.0–15.0)
MCH: 28.5 pg (ref 26.0–34.0)
MCHC: 31.9 g/dL (ref 30.0–36.0)
MCV: 89.3 fL (ref 80.0–100.0)
Platelets: 328 10*3/uL (ref 150–400)
RBC: 4.11 MIL/uL (ref 3.87–5.11)
RDW: 13.2 % (ref 11.5–15.5)
WBC: 12.8 10*3/uL — ABNORMAL HIGH (ref 4.0–10.5)
nRBC: 0 % (ref 0.0–0.2)

## 2019-08-26 LAB — GLUCOSE, CAPILLARY
Glucose-Capillary: 145 mg/dL — ABNORMAL HIGH (ref 70–99)
Glucose-Capillary: 246 mg/dL — ABNORMAL HIGH (ref 70–99)

## 2019-08-26 MED ORDER — GABAPENTIN 300 MG PO CAPS: 300.0000 mg | ORAL_CAPSULE | Freq: Three times a day (TID) | ORAL | 0 refills | Status: DC

## 2019-08-26 MED ORDER — OXYCODONE HCL 5 MG PO TABS: 5.0000 mg | ORAL_TABLET | Freq: Four times a day (QID) | ORAL | 0 refills | Status: DC | PRN

## 2019-08-26 MED ORDER — TRAMADOL HCL 50 MG PO TABS: 50.0000 mg | ORAL_TABLET | Freq: Four times a day (QID) | ORAL | 0 refills | Status: DC | PRN

## 2019-08-26 MED ORDER — METHOCARBAMOL 500 MG PO TABS: 500.0000 mg | ORAL_TABLET | Freq: Four times a day (QID) | ORAL | 0 refills | Status: DC | PRN

## 2019-08-26 MED ORDER — ASPIRIN 325 MG PO TBEC: 325.0000 mg | DELAYED_RELEASE_TABLET | Freq: Two times a day (BID) | ORAL | 0 refills | Status: AC

## 2019-08-26 NOTE — Progress Notes (Signed)
Physical Therapy Treatment Patient Details Name: Sarah Kent MRN: [id number] DOB: 23-Nov-1955 Today's Date: 08/26/2019    History of Present Illness Patient is 63 y.o. female s/p Rt TKA on 08/25/19 with PMH significant for DM and OA.    PT Comments    Pt ambulated hallway distance with combination of step-to and step-through gait, limited by antalgic gait. Pt proficiently performed step navigation with min guard assist, and successfully completed TKR exercises. Exercise handout administered, reviewed, and practiced with pt. Pt with no further questions, no further acute PT needs and pt is safe to d/c home with assist of her husband.    Follow Up Recommendations  Follow surgeon's recommendation for DC plan and follow-up therapies(OPPT on friday)     Equipment Recommendations  Rolling walker with 5" wheels(youth/short)    Recommendations for Other Services       Precautions / Restrictions Precautions Precautions: Fall Restrictions Weight Bearing Restrictions: No Other Position/Activity Restrictions: WBAT    Mobility  Bed Mobility Overal bed mobility: Needs Assistance             General bed mobility comments: pt sitting EOB upon arrival to room, in chair upon PT exit.  Transfers Overall transfer level: Needs assistance Equipment used: Rolling walker (2 wheeled) Transfers: Sit to/from Stand Sit to Stand: Supervision         General transfer comment: supervision for safety, verbal cuing for hand placement when rising x1. Sit to stand x2 from EOB, pre-gait and pre-step navigation  Ambulation/Gait Ambulation/Gait assistance: Min guard;Supervision Gait Distance (Feet): 130 Feet Assistive device: Rolling walker (2 wheeled) Gait Pattern/deviations: Step-to pattern;Step-through pattern;Decreased stride length;Trunk flexed;Decreased weight shift to right;Antalgic Gait velocity: decr   General Gait Details: Min guard to supervision for safety, verbal cuing for  upright posture, step-through gait with heel-toe walking limited by antalgic gait. Pt with good placement in RW.   Stairs Stairs: Yes Stairs assistance: Min guard Stair Management: No rails;Step to pattern;Forwards;With walker Number of Stairs: 1 General stair comments: Min guard for safety, verbal cuing for sequencing (up with the good leg first, down with the bad leg first), placement and steadying of RW by husband upon d/c demonstrated by PT.   Wheelchair Mobility    Modified Rankin (Stroke Patients Only)       Balance Overall balance assessment: Needs assistance Sitting-balance support: Feet supported Sitting balance-Leahy Scale: Good     Standing balance support: During functional activity;Bilateral upper extremity supported Standing balance-Leahy Scale: Poor Standing balance comment: reliant on RW                            Cognition Arousal/Alertness: Awake/alert Behavior During Therapy: WFL for tasks assessed/performed Overall Cognitive Status: Within Functional Limits for tasks assessed                                        Exercises Total Joint Exercises Ankle Circles/Pumps: AROM;Both;10 reps;Seated Quad Sets: AROM;10 reps;Seated;Right(with towel roll under heel) Short Arc Quad: AROM;Right;10 reps;Seated Heel Slides: AAROM;10 reps;Seated;Right(with use of manual assist from PT and gait belt loop) Hip ABduction/ADduction: AROM;Right;10 reps;Seated Straight Leg Raises: AROM;Right;5 reps;Seated Knee Flexion: AROM;Right;10 reps;Seated(with towel under foot)    General Comments        Pertinent Vitals/Pain Pain Assessment: 0-10 Pain Score: 4  Pain Location: Rt knee Pain Descriptors / Indicators: Aching;Sore Pain Intervention(s):  Limited activity within patient's tolerance;Monitored during session;Repositioned;Premedicated before session    Home Living                      Prior Function            PT Goals  (current goals can now be found in the care plan section) Acute Rehab PT Goals Patient Stated Goal: return home and get back to independence PT Goal Formulation: With patient Time For Goal Achievement: 09/01/19 Potential to Achieve Goals: Good Progress towards PT goals: Progressing toward goals    Frequency    7X/week      PT Plan Current plan remains appropriate    Co-evaluation              AM-PAC PT "6 Clicks" Mobility   Outcome Measure  Help needed turning from your back to your side while in a flat bed without using bedrails?: A Little Help needed moving from lying on your back to sitting on the side of a flat bed without using bedrails?: A Little Help needed moving to and from a bed to a chair (including a wheelchair)?: A Little Help needed standing up from a chair using your arms (e.g., wheelchair or bedside chair)?: None Help needed to walk in hospital room?: None Help needed climbing 3-5 steps with a railing? : A Little 6 Click Score: 20    End of Session Equipment Utilized During Treatment: Gait belt Activity Tolerance: Patient tolerated treatment well Patient left: with call bell/phone within reach;in chair;with family/visitor present;with chair alarm set Nurse Communication: Mobility status PT Visit Diagnosis: Muscle weakness (generalized) (M62.81);Difficulty in walking, not elsewhere classified (R26.2)     Time: 5809-9833 PT Time Calculation (min) (ACUTE ONLY): 34 min  Charges:  $Gait Training: 8-22 mins $Therapeutic Exercise: 8-22 mins                    Mirza Fessel E, PT Acute Rehabilitation Services Pager [phone]  Office [phone]   Jaxx Huish D Maevyn Riordan 08/26/2019, 12:15 PM

## 2019-08-26 NOTE — Progress Notes (Signed)
   Subjective: 1 Day Post-Op Procedure(s) (LRB): TOTAL KNEE ARTHROPLASTY (Right) Patient reports pain as mild.   Patient seen in rounds by Dr. Wynelle Link. Patient is well, and has had no acute complaints or problems other than discomfort in the right knee. No acute events overnight. Ambulated 30 feet with PT yesterday. Foley catheter removed, positive flatus. Denies CP, SHOB.  We will continue therapy today.   Objective: Vital signs in last 24 hours: Temp:  [97.6 F (36.4 C)-99.1 F (37.3 C)] 97.6 F (36.4 C) (12/15 0514) Pulse Rate:  [69-86] 69 (12/15 0514) Resp:  [10-18] 18 (12/15 0514) BP: (117-136)/(60-77) 125/70 (12/15 0514) SpO2:  [95 %-100 %] 100 % (12/15 0514) Weight:  [94.3 kg] 94.3 kg (12/14 0924)  Intake/Output from previous day:  Intake/Output Summary (Last 24 hours) at 08/26/2019 0731 Last data filed at 08/26/2019 0554 Gross per 24 hour  Intake 2335.53 ml  Output 1850 ml  Net 485.53 ml     Intake/Output this shift: No intake/output data recorded.  Labs: Recent Labs    08/26/19 0250  HGB 11.7*   Recent Labs    08/26/19 0250  WBC 12.8*  RBC 4.11  HCT 36.7  PLT 328   Recent Labs    08/26/19 0250  NA 135  K 4.8  CL 103  CO2 24  BUN 13  CREATININE 0.75  GLUCOSE 211*  CALCIUM 8.4*   No results for input(s): LABPT, INR in the last 72 hours.  Exam: General - Patient is Alert and Oriented Extremity - Neurologically intact Sensation intact distally Intact pulses distally Dorsiflexion/Plantar flexion intact Dressing - dressing C/D/I Motor Function - intact, moving foot and toes well on exam.   Past Medical History:  Diagnosis Date  . Arthritis   . Diabetes mellitus without complication ([place])   . History of kidney stones   . Peptic ulcer greater than 30 years ago  . Pneumonia    25 years ago    Assessment/Plan: 1 Day Post-Op Procedure(s) (LRB): TOTAL KNEE ARTHROPLASTY (Right) Principal Problem:   OA (osteoarthritis) of  knee  Estimated body mass index is 38.02 kg/m as calculated from the following:   Height as of this encounter: 5\' 2"  (1.575 m).   Weight as of this encounter: 94.3 kg. Advance diet Up with therapy D/C IV fluids   Patient's anticipated LOS is less than 2 midnights, meeting these requirements: - Younger than 33 - Lives within 1 hour of care - Has a competent adult at home to recover with post-op recover - NO history of  - Chronic pain requiring opiods  - Diabetes  - Coronary Artery Disease  - Heart failure  - Heart attack  - Stroke  - DVT/VTE  - Cardiac arrhythmia  - Respiratory Failure/COPD  - Renal failure  - Anemia  - Advanced Liver disease   DVT Prophylaxis - Aspirin Weight bearing as tolerated. D/C O2 and pulse ox and try on room air. Hemovac pulled without difficulty, will begin therapy today.  Plan is to go Home after hospital stay. Plan for discharge today following 1-2 sessions of therapy as long as she is meeting her goals. Scheduled for OPPT. Follow up in the office in 2 weeks.   Griffith Citron, PA-C Orthopedic Surgery [phone] 08/26/2019, 7:31 AM

## 2019-08-27 ENCOUNTER — Encounter: Payer: Self-pay | Admitting: Anesthesiology

## 2019-08-27 NOTE — Anesthesia Postprocedure Evaluation (Signed)
Anesthesia Post Note  Patient: Sarah Kent  Procedure(s) Performed: TOTAL KNEE ARTHROPLASTY (Right Knee)     Patient location during evaluation: PACU Anesthesia Type: MAC and Spinal Level of consciousness: oriented and awake and alert Pain management: pain level controlled Vital Signs Assessment: post-procedure vital signs reviewed and stable Respiratory status: spontaneous breathing, respiratory function stable and patient connected to nasal cannula oxygen Cardiovascular status: blood pressure returned to baseline and stable Postop Assessment: no headache, no backache and no apparent nausea or vomiting Anesthetic complications: no    Last Vitals:  Vitals:   08/26/19 0940 08/26/19 0944  BP: (!) 114/42 125/65  Pulse: 94   Resp: 16   Temp: (!) 36.3 C   SpO2: 99%     Last Pain:  Vitals:   08/26/19 0944  TempSrc:   PainSc: [place]

## 2019-08-27 NOTE — TOC Transition Note (Signed)
Transition of Care [facility]) - CM/SW Discharge Note   Patient Details  Name: Sarah Kent MRN: [id number] Date of Birth: 09-13-1955  Transition of Care [facility] CM/SW Contact:  Lia Hopping, [place] Phone Number: 08/27/2019, 12:05 PM   Clinical Narrative:    Patient informed CSW she had RW and grab bars at home. Patient informed Physical Therapist she did not have a rolling walker. CSW ordered RW through [place]. Delivered to the patient room.    Final next level of care: OP Rehab Barriers to Discharge: No Barriers Identified   Patient Goals and CMS Choice     Choice offered to / list presented to : NA  Discharge Placement                       Discharge Plan and Services                DME Arranged: Walker rolling DME Agency: [employer] Date DME Agency Contacted: 08/27/19 Time DME Agency Contacted: 43 Representative spoke with at DME Agency: [place] ([place]) Interventions     Readmission Risk Interventions No flowsheet data found.

## 2019-09-10 DIAGNOSIS — Z96651 Presence of right artificial knee joint: Secondary | ICD-10-CM | POA: Insufficient documentation

## 2019-11-20 ENCOUNTER — Encounter: Payer: Self-pay | Admitting: Orthopaedic Surgery

## 2019-11-20 ENCOUNTER — Other Ambulatory Visit: Payer: Self-pay

## 2019-11-20 ENCOUNTER — Ambulatory Visit (INDEPENDENT_AMBULATORY_CARE_PROVIDER_SITE_OTHER): Payer: 59 | Admitting: Orthopaedic Surgery

## 2019-11-20 VITALS — BP 115/73 | HR 102 | Ht 62.0 in | Wt 200.0 lb

## 2019-11-20 DIAGNOSIS — G8929 Other chronic pain: Secondary | ICD-10-CM

## 2019-11-20 DIAGNOSIS — M25572 Pain in left ankle and joints of left foot: Secondary | ICD-10-CM | POA: Diagnosis not present

## 2019-11-20 MED ORDER — HYDROCODONE-ACETAMINOPHEN 5-325 MG PO TABS: 1.0000 | ORAL_TABLET | Freq: Four times a day (QID) | ORAL | 0 refills | Status: AC | PRN

## 2019-11-20 NOTE — Progress Notes (Signed)
Patient Sarah Kent, female DOB:[birth year], 64 y.o. YCX:[id number]  Chief Complaint  Patient presents with  . Knee Pain    right   . Ankle Pain    left    HPI  Sarah Kent is a 64 y.o. female who has left ankle pain. She has been walking more recently.  She had prior surgery years ago on the ankle.  She had a total knee done in December of 2020.  She is starting to walk more now.  She has no redness, no swelling.   Body mass index is 36.58 kg/m.  ROS  Review of Systems  HENT: Negative for congestion.   Respiratory: Negative for shortness of breath.   Cardiovascular: Negative for chest pain.  Endocrine: Positive for cold intolerance.  Musculoskeletal: Positive for arthralgias, gait problem and joint swelling.  Allergic/Immunologic: Positive for environmental allergies.  All other systems reviewed and are negative.   All other systems reviewed and are negative.  The following is a summary of the past history medically, past history surgically, known current medicines, social history and family history.  This information is gathered electronically by the computer from prior information and documentation.  I review this each visit and have found including this information at this point in the chart is beneficial and informative.    Past Medical History:  Diagnosis Date  . Arthritis   . Diabetes mellitus without complication ([place])   . History of kidney stones   . Peptic ulcer greater than 30 years ago  . Pneumonia    25 years ago    Past Surgical History:  Procedure Laterality Date  . ANKLE ARTHROSCOPY W/ OPEN REPAIR Left   . LITHOTRIPSY     Twice  . TOTAL KNEE ARTHROPLASTY Right 08/25/2019   Procedure: TOTAL KNEE ARTHROPLASTY;  Surgeon: Gaynelle Arabian, MD;  Location: [facility];  Service: Orthopedics;  Laterality: Right;  4min  . TUBAL LIGATION  1984    History reviewed. No pertinent family history.  Social History Social History   Tobacco Use  . Smoking  status: Never Smoker  . Smokeless tobacco: Never Used  Substance Use Topics  . Alcohol use: No  . Drug use: No    No Known Allergies  Current Outpatient Medications  Medication Sig Dispense Refill  . gabapentin (NEURONTIN) 300 MG capsule Take 1 capsule (300 mg total) by mouth 3 (three) times daily. Take a 300 mg capsule three times a day for two weeks following surgery.Then take a 300 mg capsule two times a day for two weeks. Then take a 300 mg capsule once a day for two weeks. Then discontinue the Gabapentin. 84 capsule 0  . Liraglutide (VICTOZA) 18 MG/3ML SOPN Inject 1.2 mg into the skin once.    Marland Kitchen lisinopril (PRINIVIL,ZESTRIL) 10 MG tablet Take 10 mg by mouth daily.    . metFORMIN (GLUCOPHAGE-XR) 500 MG 24 hr tablet Take 500 mg by mouth 3 (three) times daily with meals.    . simvastatin (ZOCOR) 10 MG tablet Take 10 mg by mouth at bedtime.     Marland Kitchen HYDROcodone-acetaminophen (NORCO/VICODIN) 5-325 MG tablet Take 1 tablet by mouth every 6 (six) hours as needed for moderate pain (Must last 30 days.). 90 tablet 0   No current facility-administered medications for this visit.     Physical Exam  Blood pressure 115/73, pulse (!) 102, height 5\' 2"  (1.575 m), weight 200 lb (90.7 kg).  Constitutional: overall normal hygiene, normal nutrition, well developed, normal grooming, normal body  habitus. Assistive device:none  Musculoskeletal: gait and station Limp none, muscle tone and strength are normal, no tremors or atrophy is present.  .  Neurological: coordination overall normal.  Deep tendon reflex/nerve stretch intact.  Sensation normal.  Cranial nerves II-XII intact.   Skin:   Normal overall no scars, lesions, ulcers or rashes. No psoriasis.  Psychiatric: Alert and oriented x 3.  Recent memory intact, remote memory unclear.  Normal mood and affect. Well groomed.  Good eye contact.  Cardiovascular: overall no swelling, no varicosities, no edema bilaterally, normal temperatures of the legs  and arms, no clubbing, cyanosis and good capillary refill.  Lymphatic: palpation is normal.  Right knee wound healed well  Very good ROM.  Left ankle tender but has full motion.  NV intact.  No limp. All other systems reviewed and are negative   The patient has been educated about the nature of the problem(s) and counseled on treatment options.  The patient appeared to understand what I have discussed and is in agreement with it.  Encounter Diagnosis  Name Primary?  . Chronic pain of left ankle Yes    PLAN Call if any problems.  Precautions discussed.  Continue current medications.   Return to clinic prn   I have reviewed the [facility] Controlled Substance Reporting System web site prior to prescribing narcotic medicine for this patient.   Electronically Signed Darreld Mclean, MD 3/11/20219:48 AM

## 2020-06-03 ENCOUNTER — Encounter: Payer: Self-pay | Admitting: Orthopaedic Surgery

## 2020-06-03 ENCOUNTER — Other Ambulatory Visit: Payer: Self-pay

## 2020-06-03 ENCOUNTER — Ambulatory Visit: Payer: 59 | Admitting: Orthopaedic Surgery

## 2020-06-03 VITALS — BP 146/77 | HR 95 | Ht 62.0 in | Wt 201.0 lb

## 2020-06-03 DIAGNOSIS — G8929 Other chronic pain: Secondary | ICD-10-CM | POA: Diagnosis not present

## 2020-06-03 DIAGNOSIS — M25572 Pain in left ankle and joints of left foot: Secondary | ICD-10-CM | POA: Diagnosis not present

## 2020-06-03 MED ORDER — HYDROCODONE-ACETAMINOPHEN 5-325 MG PO TABS: 1.0000 | ORAL_TABLET | Freq: Four times a day (QID) | ORAL | 0 refills | Status: AC | PRN

## 2020-06-03 NOTE — Progress Notes (Signed)
Patient Sarah Kent, female DOB:1956-06-04, 64 y.o. PTW:[id number]  Chief Complaint  Patient presents with  . Foot Pain    Left ankle/foot pain     HPI  Sarah Kent is a 64 y.o. female who has pain of the left ankle at times.  I did surgery on her ankle around 1985.  She has pain more with very warm weather.  She is much better today and has no pain and full motion.  She takes Naprosyn 500 every other day Monday thru Friday and it helps. She uses Voltaren Gel at times.  Over the last week her pain has gone away.  She kept the appointment.  She has no new trauma, no redness, no swelling and no numbness.   Body mass index is 36.76 kg/m.  ROS  Review of Systems  HENT: Negative for congestion.   Respiratory: Negative for shortness of breath.   Cardiovascular: Negative for chest pain.  Endocrine: Positive for cold intolerance.  Musculoskeletal: Positive for arthralgias, gait problem and joint swelling.  Allergic/Immunologic: Positive for environmental allergies.  All other systems reviewed and are negative.   All other systems reviewed and are negative.  The following is a summary of the past history medically, past history surgically, known current medicines, social history and family history.  This information is gathered electronically by the computer from prior information and documentation.  I review this each visit and have found including this information at this point in the chart is beneficial and informative.    Past Medical History:  Diagnosis Date  . Arthritis   . Diabetes mellitus without complication ([facility])   . History of kidney stones   . Peptic ulcer greater than 30 years ago  . Pneumonia    25 years ago    Past Surgical History:  Procedure Laterality Date  . ANKLE ARTHROSCOPY W/ OPEN REPAIR Left   . LITHOTRIPSY     Twice  . TOTAL KNEE ARTHROPLASTY Right 08/25/2019   Procedure: TOTAL KNEE ARTHROPLASTY;  Surgeon: Ollen Gross, MD;  Location: [facility];   Service: Orthopedics;  Laterality: Right;   . TUBAL LIGATION  1984    History reviewed. No pertinent family history.  Social History Social History   Tobacco Use  . Smoking status: Never Smoker  . Smokeless tobacco: Never Used  Vaping Use  . Vaping Use: Never used  Substance Use Topics  . Alcohol use: No  . Drug use: No    No Known Allergies  Current Outpatient Medications  Medication Sig Dispense Refill  . Liraglutide (VICTOZA) 18 MG/3ML SOPN Inject 1.2 mg into the skin once.    Marland Kitchen lisinopril (PRINIVIL,ZESTRIL) 10 MG tablet Take 10 mg by mouth daily.    . metFORMIN (GLUCOPHAGE-XR) 500 MG 24 hr tablet Take 500 mg by mouth 3 (three) times daily with meals.    . simvastatin (ZOCOR) 10 MG tablet Take 10 mg by mouth at bedtime.     . gabapentin (NEURONTIN) 300 MG capsule Take 1 capsule (300 mg total) by mouth 3 (three) times daily. Take a 300 mg capsule three times a day for two weeks following surgery.Then take a 300 mg capsule two times a day for two weeks. Then take a 300 mg capsule once a day for two weeks. Then discontinue the Gabapentin. (Patient not taking: Reported on 06/03/2020) 84 capsule 0  . HYDROcodone-acetaminophen (NORCO/VICODIN) 5-325 MG tablet Take 1 tablet by mouth every 6 (six) hours as needed for moderate pain (Must last  30 days.). 90 tablet 0   No current facility-administered medications for this visit.     Physical Exam  Blood pressure (!) 146/77, pulse 95, height 5\' 2"  (1.575 m), weight 201 lb (91.2 kg).  Constitutional: overall normal hygiene, normal nutrition, well developed, normal grooming, normal body habitus. Assistive device:none  Musculoskeletal: gait and station Limp none, muscle tone and strength are normal, no tremors or atrophy is present.  .  Neurological: coordination overall normal.  Deep tendon reflex/nerve stretch intact.  Sensation normal.  Cranial nerves II-XII intact.   Skin:   Normal overall no scars, lesions, ulcers or  rashes. No psoriasis.  Psychiatric: Alert and oriented x 3.  Recent memory intact, remote memory unclear.  Normal mood and affect. Well groomed.  Good eye contact.  Cardiovascular: overall no swelling, no varicosities, no edema bilaterally, normal temperatures of the legs and arms, no clubbing, cyanosis and good capillary refill.  Lymphatic: palpation is normal.  Left ankle with full motion.  Well healed scar medially.  NV intact, no edema.  Normal gait.  All other systems reviewed and are negative   The patient has been educated about the nature of the problem(s) and counseled on treatment options.  The patient appeared to understand what I have discussed and is in agreement with it.  Encounter Diagnosis  Name Primary?  . Chronic pain of left ankle Yes    PLAN Call if any problems.  Precautions discussed.  Continue current medications.   Return to clinic as needed.   I have reviewed the Controlled Substance Reporting System web site prior to prescribing narcotic medicine for this patient.   Electronically Signed [place], MD 9/23/202110:06 AM

## 2020-07-21 DIAGNOSIS — Z23 Encounter for immunization: Secondary | ICD-10-CM | POA: Diagnosis not present

## 2020-08-19 DIAGNOSIS — Z96651 Presence of right artificial knee joint: Secondary | ICD-10-CM | POA: Diagnosis not present

## 2020-09-16 DIAGNOSIS — I10 Essential (primary) hypertension: Secondary | ICD-10-CM | POA: Diagnosis not present

## 2020-09-16 DIAGNOSIS — E1122 Type 2 diabetes mellitus with diabetic chronic kidney disease: Secondary | ICD-10-CM | POA: Diagnosis not present

## 2020-09-16 DIAGNOSIS — N189 Chronic kidney disease, unspecified: Secondary | ICD-10-CM | POA: Diagnosis not present

## 2020-09-16 DIAGNOSIS — E782 Mixed hyperlipidemia: Secondary | ICD-10-CM | POA: Diagnosis not present

## 2020-09-16 DIAGNOSIS — E875 Hyperkalemia: Secondary | ICD-10-CM | POA: Diagnosis not present

## 2020-09-16 DIAGNOSIS — E7849 Other hyperlipidemia: Secondary | ICD-10-CM | POA: Diagnosis not present

## 2020-09-22 DIAGNOSIS — E1165 Type 2 diabetes mellitus with hyperglycemia: Secondary | ICD-10-CM | POA: Diagnosis not present

## 2020-09-22 DIAGNOSIS — I10 Essential (primary) hypertension: Secondary | ICD-10-CM | POA: Diagnosis not present

## 2020-09-22 DIAGNOSIS — E875 Hyperkalemia: Secondary | ICD-10-CM | POA: Diagnosis not present

## 2020-09-22 DIAGNOSIS — M898X2 Other specified disorders of bone, upper arm: Secondary | ICD-10-CM | POA: Diagnosis not present

## 2020-09-22 DIAGNOSIS — E7849 Other hyperlipidemia: Secondary | ICD-10-CM | POA: Diagnosis not present

## 2020-09-22 DIAGNOSIS — Z6835 Body mass index (BMI) 35.0-35.9, adult: Secondary | ICD-10-CM | POA: Diagnosis not present

## 2020-10-07 ENCOUNTER — Ambulatory Visit (INDEPENDENT_AMBULATORY_CARE_PROVIDER_SITE_OTHER): Payer: HMO | Admitting: Orthopaedic Surgery

## 2020-10-07 ENCOUNTER — Encounter: Payer: Self-pay | Admitting: Orthopaedic Surgery

## 2020-10-07 ENCOUNTER — Other Ambulatory Visit: Payer: Self-pay

## 2020-10-07 VITALS — BP 125/69 | HR 100 | Ht 62.0 in | Wt 200.0 lb

## 2020-10-07 DIAGNOSIS — M25511 Pain in right shoulder: Secondary | ICD-10-CM | POA: Diagnosis not present

## 2020-10-07 DIAGNOSIS — G8929 Other chronic pain: Secondary | ICD-10-CM

## 2020-10-07 NOTE — Patient Instructions (Signed)
Use Aspercreme, Biofreeze or Voltaren gel over the counter 2-3 times daily make sure you rub it in well each time you use it.   

## 2020-10-07 NOTE — Progress Notes (Signed)
Patient Sarah Kent, female DOB:06/21/1956, 65 y.o. JYN:[id number]  Chief Complaint  Patient presents with  . Arm Pain    Right arm, Patient reports that it started hurting and naprpoxyn did not help much,     HPI  Sarah Kent is a 65 y.o. female who has developed pain in the right shoulder.  She was seen by Dr. Neita Carp earlier this month and had x-rays which were negative.  She has pain in extension.  She has taken Naprosyn for one week per his directions and got better.  She is concerned as a close friend had shoulder pain but was found out to have a tumor and needed a fourquarter resection at [facility] She lived to be 93.  The patient is concerned about a possible tumor.  I told her we could do a MRI.  She will check with her insurance company.  She says she is perhaps overly concerned and may not desire a MRI.  I told her I can order if she likes.  She will let me know.  I have told her about resuming the Naprosyn for another week.  She can use one of the rubs for the shoulder.   Body mass index is 36.58 kg/m.  ROS  Review of Systems  HENT: Negative for congestion.   Respiratory: Negative for shortness of breath.   Cardiovascular: Negative for chest pain.  Endocrine: Positive for cold intolerance.  Musculoskeletal: Positive for arthralgias, gait problem and joint swelling.  Allergic/Immunologic: Positive for environmental allergies.  All other systems reviewed and are negative.   All other systems reviewed and are negative.  The following is a summary of the past history medically, past history surgically, known current medicines, social history and family history.  This information is gathered electronically by the computer from prior information and documentation.  I review this each visit and have found including this information at this point in the chart is beneficial and informative.    Past Medical History:  Diagnosis Date  . Arthritis   . Diabetes mellitus  without complication (HCC)   . History of kidney stones   . Peptic ulcer greater than 30 years ago  . Pneumonia    25 years ago    Past Surgical History:  Procedure Laterality Date  . ANKLE ARTHROSCOPY W/ OPEN REPAIR Left   . LITHOTRIPSY     Twice  . TOTAL KNEE ARTHROPLASTY Right 08/25/2019   Procedure: TOTAL KNEE ARTHROPLASTY;  Surgeon: Ollen Gross, MD;  Location: [facility];  Service: Orthopedics;  Laterality: Right;   . TUBAL LIGATION  1984    History reviewed. No pertinent family history.  Social History Social History   Tobacco Use  . Smoking status: Never Smoker  . Smokeless tobacco: Never Used  Vaping Use  . Vaping Use: Never used  Substance Use Topics  . Alcohol use: No  . Drug use: No    No Known Allergies  Current Outpatient Medications  Medication Sig Dispense Refill  . liraglutide (VICTOZA) 18 MG/3ML SOPN Inject 1.2 mg into the skin once.    Marland Kitchen lisinopril (ZESTRIL) 5 MG tablet Take 5 mg by mouth daily.    . metFORMIN (GLUCOPHAGE-XR) 500 MG 24 hr tablet Take 500 mg by mouth 3 (three) times daily with meals.    . simvastatin (ZOCOR) 10 MG tablet Take 10 mg by mouth at bedtime.      No current facility-administered medications for this visit.     Physical Exam  Blood pressure 125/69, pulse 100, height 5\' 2"  (1.575 m), weight 200 lb (90.7 kg).  Constitutional: overall normal hygiene, normal nutrition, well developed, normal grooming, normal body habitus. Assistive device:none  Musculoskeletal: gait and station Limp none, muscle tone and strength are normal, no tremors or atrophy is present.  .  Neurological: coordination overall normal.  Deep tendon reflex/nerve stretch intact.  Sensation normal.  Cranial nerves II-XII intact.   Skin:   Normal overall no scars, lesions, ulcers or rashes. No psoriasis.  Psychiatric: Alert and oriented x 3.  Recent memory intact, remote memory unclear.  Normal mood and affect. Well groomed.  Good eye  contact.  Cardiovascular: overall no swelling, no varicosities, no edema bilaterally, normal temperatures of the legs and arms, no clubbing, cyanosis and good capillary refill.  Lymphatic: palpation is normal.  Motion of the right shoulder is full but pain in the area of extension.  All other systems reviewed and are negative   The patient has been educated about the nature of the problem(s) and counseled on treatment options.  The patient appeared to understand what I have discussed and is in agreement with it.  Encounter Diagnosis  Name Primary?  . Chronic right shoulder pain Yes    PLAN Call if any problems.  Precautions discussed.  Continue current medications.   Return to clinic as needed.  She will let know if she wants a MRI of the right shoulder.   Electronically Signed [place], MD 1/27/202210:31 AM

## 2020-10-12 ENCOUNTER — Other Ambulatory Visit: Payer: Self-pay | Admitting: Orthopaedic Surgery

## 2020-10-12 ENCOUNTER — Telehealth: Payer: Self-pay | Admitting: Orthopaedic Surgery

## 2020-10-12 DIAGNOSIS — G8929 Other chronic pain: Secondary | ICD-10-CM

## 2020-10-12 DIAGNOSIS — M25511 Pain in right shoulder: Secondary | ICD-10-CM

## 2020-10-12 NOTE — Telephone Encounter (Signed)
Ting called and said that Dr Hilda Lias had mentioned about having an MRI.  She says she has checked on her benefits for getting an MRI and she has decided that she does want to go ahead with it.  Can we please get this approved and scheduled for her?  Thanks so much

## 2020-10-14 NOTE — Telephone Encounter (Signed)
I discussed this and get MRI with and without dye.

## 2020-10-14 NOTE — Telephone Encounter (Signed)
Order entered for MRI Right Shoulder w/o contrast.

## 2020-10-14 NOTE — Telephone Encounter (Signed)
Patient has decided she wants to proceed with MRI, please advise what to order?  Regular MRI w/o? Or something else?  Thanks-

## 2020-10-27 ENCOUNTER — Other Ambulatory Visit: Payer: Self-pay

## 2020-10-27 ENCOUNTER — Ambulatory Visit (HOSPITAL_COMMUNITY)
Admission: RE | Admit: 2020-10-27 | Discharge: 2020-10-27 | Disposition: A | Discharge status: Home / Self Care | Payer: HMO | Source: Ambulatory Visit | Attending: Orthopaedic Surgery | Admitting: Orthopaedic Surgery

## 2020-10-27 DIAGNOSIS — G8929 Other chronic pain: Secondary | ICD-10-CM | POA: Insufficient documentation

## 2020-10-27 DIAGNOSIS — M25511 Pain in right shoulder: Secondary | ICD-10-CM | POA: Diagnosis not present

## 2020-11-02 ENCOUNTER — Encounter: Payer: Self-pay | Admitting: Orthopaedic Surgery

## 2020-11-02 ENCOUNTER — Other Ambulatory Visit: Payer: Self-pay

## 2020-11-02 ENCOUNTER — Ambulatory Visit (INDEPENDENT_AMBULATORY_CARE_PROVIDER_SITE_OTHER): Payer: HMO | Admitting: Orthopaedic Surgery

## 2020-11-02 VITALS — Ht 62.0 in | Wt 200.0 lb

## 2020-11-02 DIAGNOSIS — M25511 Pain in right shoulder: Secondary | ICD-10-CM | POA: Diagnosis not present

## 2020-11-02 DIAGNOSIS — G8929 Other chronic pain: Secondary | ICD-10-CM | POA: Diagnosis not present

## 2020-11-02 NOTE — Progress Notes (Signed)
Patient QB:HALPF Sarah Kent, female DOB:October 01, 1955, 65 y.o. XTK:[id number]  Chief Complaint  Patient presents with  . Shoulder Pain    Rt shoulder MRI results    HPI  Sarah Kent is a 65 y.o. female who has right shoulder pain that is a little better.  She has pain with overhead use.  She has no new trauma.  MRI showed:  IMPRESSION: 1. Mild rotator cuff tendinosis without tear. 2. Mild intra-articular biceps tendinosis and tenosynovitis. 3. Mild glenohumeral and AC joint osteoarthritis.  I have explained the findings to her.  No surgery is needed at this time.  I have independently reviewed the MRI.        Body mass index is 36.58 kg/m.  ROS  Review of Systems  HENT: Negative for congestion.   Respiratory: Negative for shortness of breath.   Cardiovascular: Negative for chest pain.  Endocrine: Positive for cold intolerance.  Musculoskeletal: Positive for arthralgias, gait problem and joint swelling.  Allergic/Immunologic: Positive for environmental allergies.  All other systems reviewed and are negative.   All other systems reviewed and are negative.  The following is a summary of the past history medically, past history surgically, known current medicines, social history and family history.  This information is gathered electronically by the computer from prior information and documentation.  I review this each visit and have found including this information at this point in the chart is beneficial and informative.    Past Medical History:  Diagnosis Date  . Arthritis   . Diabetes mellitus without complication (HCC)   . History of kidney stones   . Peptic ulcer greater than 30 years ago  . Pneumonia    25 years ago    Past Surgical History:  Procedure Laterality Date  . ANKLE ARTHROSCOPY W/ OPEN REPAIR Left   . LITHOTRIPSY     Twice  . TOTAL KNEE ARTHROPLASTY Right 08/25/2019   Procedure: TOTAL KNEE ARTHROPLASTY;  Surgeon: Ollen Gross, MD;   Location: [facility];  Service: Orthopedics;  Laterality: Right;   . TUBAL LIGATION  1984    History reviewed. No pertinent family history.  Social History Social History   Tobacco Use  . Smoking status: Never Smoker  . Smokeless tobacco: Never Used  Vaping Use  . Vaping Use: Never used  Substance Use Topics  . Alcohol use: No  . Drug use: No    No Known Allergies  Current Outpatient Medications  Medication Sig Dispense Refill  . liraglutide (VICTOZA) 18 MG/3ML SOPN Inject 1.2 mg into the skin once.    Marland Kitchen lisinopril (ZESTRIL) 5 MG tablet Take 5 mg by mouth daily.    . metFORMIN (GLUCOPHAGE-XR) 500 MG 24 hr tablet Take 500 mg by mouth 3 (three) times daily with meals.    . simvastatin (ZOCOR) 10 MG tablet Take 10 mg by mouth at bedtime.      No current facility-administered medications for this visit.     Physical Exam  Height 5\' 2"  (1.575 m), weight 200 lb (90.7 kg).  Constitutional: overall normal hygiene, normal nutrition, well developed, normal grooming, normal body habitus. Assistive device:none  Musculoskeletal: gait and station Limp none, muscle tone and strength are normal, no tremors or atrophy is present.  .  Neurological: coordination overall normal.  Deep tendon reflex/nerve stretch intact.  Sensation normal.  Cranial nerves II-XII intact.   Skin:   Normal overall no scars, lesions, ulcers or rashes. No psoriasis.  Psychiatric: Alert and oriented x 3.  Recent memory intact, remote memory unclear.  Normal mood and affect. Well groomed.  Good eye contact.  Cardiovascular: overall no swelling, no varicosities, no edema bilaterally, normal temperatures of the legs and arms, no clubbing, cyanosis and good capillary refill.  Lymphatic: palpation is normal.  All other systems reviewed and are negative   The patient has been educated about the nature of the problem(s) and counseled on treatment options.  The patient appeared to understand what I have discussed  and is in agreement with it.  Encounter Diagnosis  Name Primary?  . Chronic right shoulder pain Yes    PLAN Call if any problems.  Precautions discussed.  Continue current medications.   Return to clinic 6 weeks   Electronically Signed Darreld Mclean, MD 2/22/20229:59 AM

## 2020-11-25 DIAGNOSIS — Z1231 Encounter for screening mammogram for malignant neoplasm of breast: Secondary | ICD-10-CM | POA: Diagnosis not present

## 2020-12-14 ENCOUNTER — Encounter: Payer: Self-pay | Admitting: Orthopaedic Surgery

## 2020-12-14 ENCOUNTER — Other Ambulatory Visit: Payer: Self-pay

## 2020-12-14 ENCOUNTER — Ambulatory Visit: Payer: HMO | Admitting: Orthopaedic Surgery

## 2020-12-14 VITALS — BP 134/75 | HR 103 | Ht 62.0 in | Wt 202.0 lb

## 2020-12-14 DIAGNOSIS — M25511 Pain in right shoulder: Secondary | ICD-10-CM | POA: Diagnosis not present

## 2020-12-14 DIAGNOSIS — G8929 Other chronic pain: Secondary | ICD-10-CM

## 2020-12-14 NOTE — Progress Notes (Signed)
My shoulder hurts at times.  She has right shoulder pain that varies with activity and weather.  Aleve helps.  She has no new trauma, no weakness.  Encounter Diagnosis  Name Primary?  . Chronic right shoulder pain Yes   I will see her as needed.  Call if any problem.  Precautions discussed.   Electronically Signed Darreld Mclean, MD 4/5/202210:01 AM

## 2021-01-20 DIAGNOSIS — E875 Hyperkalemia: Secondary | ICD-10-CM | POA: Diagnosis not present

## 2021-01-20 DIAGNOSIS — N183 Chronic kidney disease, stage 3 unspecified: Secondary | ICD-10-CM | POA: Diagnosis not present

## 2021-01-20 DIAGNOSIS — E7800 Pure hypercholesterolemia, unspecified: Secondary | ICD-10-CM | POA: Diagnosis not present

## 2021-01-20 DIAGNOSIS — E7801 Familial hypercholesterolemia: Secondary | ICD-10-CM | POA: Diagnosis not present

## 2021-01-20 DIAGNOSIS — E7849 Other hyperlipidemia: Secondary | ICD-10-CM | POA: Diagnosis not present

## 2021-01-20 DIAGNOSIS — E119 Type 2 diabetes mellitus without complications: Secondary | ICD-10-CM | POA: Diagnosis not present

## 2021-01-20 DIAGNOSIS — E782 Mixed hyperlipidemia: Secondary | ICD-10-CM | POA: Diagnosis not present

## 2021-01-25 DIAGNOSIS — E1165 Type 2 diabetes mellitus with hyperglycemia: Secondary | ICD-10-CM | POA: Diagnosis not present

## 2021-01-25 DIAGNOSIS — E1122 Type 2 diabetes mellitus with diabetic chronic kidney disease: Secondary | ICD-10-CM | POA: Diagnosis not present

## 2021-01-25 DIAGNOSIS — I10 Essential (primary) hypertension: Secondary | ICD-10-CM | POA: Diagnosis not present

## 2021-01-25 DIAGNOSIS — E7849 Other hyperlipidemia: Secondary | ICD-10-CM | POA: Diagnosis not present

## 2021-01-25 DIAGNOSIS — Z1331 Encounter for screening for depression: Secondary | ICD-10-CM | POA: Diagnosis not present

## 2021-01-25 DIAGNOSIS — Z1389 Encounter for screening for other disorder: Secondary | ICD-10-CM | POA: Diagnosis not present

## 2021-01-25 DIAGNOSIS — Z23 Encounter for immunization: Secondary | ICD-10-CM | POA: Diagnosis not present

## 2021-01-25 DIAGNOSIS — E875 Hyperkalemia: Secondary | ICD-10-CM | POA: Diagnosis not present

## 2021-02-01 DIAGNOSIS — E119 Type 2 diabetes mellitus without complications: Secondary | ICD-10-CM | POA: Diagnosis not present

## 2021-02-01 DIAGNOSIS — H2513 Age-related nuclear cataract, bilateral: Secondary | ICD-10-CM | POA: Diagnosis not present

## 2021-02-01 DIAGNOSIS — H524 Presbyopia: Secondary | ICD-10-CM | POA: Diagnosis not present

## 2021-02-01 DIAGNOSIS — Z7984 Long term (current) use of oral hypoglycemic drugs: Secondary | ICD-10-CM | POA: Diagnosis not present

## 2021-02-01 DIAGNOSIS — H5203 Hypermetropia, bilateral: Secondary | ICD-10-CM | POA: Diagnosis not present

## 2021-02-01 DIAGNOSIS — H353131 Nonexudative age-related macular degeneration, bilateral, early dry stage: Secondary | ICD-10-CM | POA: Diagnosis not present

## 2021-02-01 DIAGNOSIS — H40013 Open angle with borderline findings, low risk, bilateral: Secondary | ICD-10-CM | POA: Diagnosis not present

## 2021-02-01 DIAGNOSIS — H52223 Regular astigmatism, bilateral: Secondary | ICD-10-CM | POA: Diagnosis not present

## 2021-03-08 ENCOUNTER — Other Ambulatory Visit: Payer: Self-pay

## 2021-03-08 ENCOUNTER — Encounter: Payer: Self-pay | Admitting: Orthopaedic Surgery

## 2021-03-08 ENCOUNTER — Ambulatory Visit (INDEPENDENT_AMBULATORY_CARE_PROVIDER_SITE_OTHER): Payer: HMO | Admitting: Orthopaedic Surgery

## 2021-03-08 VITALS — BP 129/69 | HR 101 | Ht 62.0 in | Wt 202.6 lb

## 2021-03-08 DIAGNOSIS — M5442 Lumbago with sciatica, left side: Secondary | ICD-10-CM | POA: Diagnosis not present

## 2021-03-08 MED ORDER — HYDROCODONE-ACETAMINOPHEN 5-325 MG PO TABS: 1.0000 | ORAL_TABLET | Freq: Four times a day (QID) | ORAL | 0 refills | Status: AC | PRN

## 2021-03-08 NOTE — Progress Notes (Signed)
My hip and leg are hurting.  She has had pain in the left buttocks and radiating to the left knee and slightly beyond for several weeks.  She was at [facility] lifting a large plastic container and felt a pull in her back.  She did OK but the next day she had burning pain to the buttock and going past the knee.  She took Aleve one bid and then did it every other day.  It helped. But the pain came back firmly Thursday, went away Friday and returned today.  She sees no pattern to the pain beginning or ending.  She has not done anything odd.  She has no weakness, no numbness.  She has Vicodin and took a 1/2 pill Thursday and this morning.  She is almost out of her pain medicine.  Spine/Pelvis examination:  Inspection:  Overall, sacoiliac joint benign and hips nontender; without crepitus or defects.   Thoracic spine inspection: Alignment normal without kyphosis present   Lumbar spine inspection:  Alignment  with normal lumbar lordosis, without scoliosis apparent.   Thoracic spine palpation:  without tenderness of spinal processes   Lumbar spine palpation: without tenderness of lumbar area; without tightness of lumbar muscles    Range of Motion:   Lumbar flexion, forward flexion is normal without pain or tenderness    Lumbar extension is full without pain or tenderness   Left lateral bend is normal without pain or tenderness   Right lateral bend is normal without pain or tenderness   Straight leg raising is normal  Strength & tone: normal   Stability overall normal stability  Encounter Diagnosis  Name Primary?   Acute left-sided low back pain with left-sided sciatica Yes   I will have her do the Aleve bid two pc.  She has diabetes, A1C was 6.8 recently and I will not give prednisone dose pack at this time.  Return in two weeks.  If she gets worse, I may reconsider the prednisone and she may need X-rays and MRI.  I have reviewed the [place] Controlled Substance Reporting  System web site prior to prescribing narcotic medicine for this patient.    Call if any problem.  Precautions discussed.  Electronically Signed Darreld Mclean, MD 6/28/20222:02 PM

## 2021-03-08 NOTE — Patient Instructions (Addendum)
Take 2 Aleve twice daily for the next week this will not raise your blood sugar like the prednisone would  If you are not better by next week (Tuesday) call us and Dr Hilda Lias will send in the prednisone   Sciatica  Sciatica is pain, weakness, tingling, or loss of feeling (numbness) along the sciatic nerve. The sciatic nerve starts in the lower back and goes down the back of each leg. Sciatica usually goes away on its own or with treatment. Sometimes, sciatica may come back (recur). What are the causes? This condition happens when the sciatic nerve is pinched or has pressure put on it. This may be the result of: A disk in between the bones of the spine bulging out too far (herniated disk). Changes in the spinal disks that occur with aging. A condition that affects a muscle in the butt. Extra bone growth near the sciatic nerve. A break (fracture) of the area between your hip bones (pelvis). Pregnancy. Tumor. This is rare. What increases the risk? You are more likely to develop this condition if you: Play sports that put pressure or stress on the spine. Have poor strength and ease of movement (flexibility). Have had a back injury in the past. Have had back surgery. Sit for long periods of time. Do activities that involve bending or lifting over and over again. Are very overweight (obese). What are the signs or symptoms? Symptoms can vary from mild to very bad. They may include: Any of these problems in the lower back, leg, hip, or butt: Mild tingling, loss of feeling, or dull aches. Burning sensations. Sharp pains. Loss of feeling in the back of the calf or the sole of the foot. Leg weakness. Very bad back pain that makes it hard to move. These symptoms may get worse when you cough, sneeze, or laugh. They may alsoget worse when you sit or stand for long periods of time. How is this treated? This condition often gets better without any treatment. However, treatment may  include: Changing or cutting back on physical activity when you have pain. Doing exercises and stretching. Putting ice or heat on the affected area. Medicines that help: To relieve pain and swelling. To relax your muscles. Shots (injections) of medicines that help to relieve pain, irritation, and swelling. Surgery. Follow these instructions at home: Medicines Take over-the-counter and prescription medicines only as told by your doctor. Ask your doctor if the medicine prescribed to you: Requires you to avoid driving or using heavy machinery. Can cause trouble pooping (constipation). You may need to take these steps to prevent or treat trouble pooping: Drink enough fluids to keep your pee (urine) pale yellow. Take over-the-counter or prescription medicines. Eat foods that are high in fiber. These include beans, whole grains, and fresh fruits and vegetables. Limit foods that are high in fat and sugar. These include fried or sweet foods. Managing pain     If told, put ice on the affected area. Put ice in a plastic bag. Place a towel between your skin and the bag. Leave the ice on for 20 minutes, 2-3 times a day. If told, put heat on the affected area. Use the heat source that your doctor tells you to use, such as a moist heat pack or a heating pad. Place a towel between your skin and the heat source. Leave the heat on for 20-30 minutes. Remove the heat if your skin turns bright red. This is very important if you are unable to feel pain,  heat, or cold. You may have a greater risk of getting burned. Activity  Return to your normal activities as told by your doctor. Ask your doctor what activities are safe for you. Avoid activities that make your symptoms worse. Take short rests during the day. When you rest for a long time, do some physical activity or stretching between periods of rest. Avoid sitting for a long time without moving. Get up and move around at least one time each  hour. Exercise and stretch regularly, as told by your doctor. Do not lift anything that is heavier than 10 lb (4.5 kg) while you have symptoms of sciatica. Avoid lifting heavy things even when you do not have symptoms. Avoid lifting heavy things over and over. When you lift objects, always lift in a way that is safe for your body. To do this, you should: Bend your knees. Keep the object close to your body. Avoid twisting.  General instructions Stay at a healthy weight. Wear comfortable shoes that support your feet. Avoid wearing high heels. Avoid sleeping on a mattress that is too soft or too hard. You might have less pain if you sleep on a mattress that is firm enough to support your back. Keep all follow-up visits as told by your doctor. This is important. Contact a doctor if: You have pain that: Wakes you up when you are sleeping. Gets worse when you lie down. Is worse than the pain you have had in the past. Lasts longer than 4 weeks. You lose weight without trying. Get help right away if: You cannot control when you pee (urinate) or poop (have a bowel movement). You have weakness in any of these areas and it gets worse: Lower back. The area between your hip bones. Butt. Legs. You have redness or swelling of your back. You have a burning feeling when you pee. Summary Sciatica is pain, weakness, tingling, or loss of feeling (numbness) along the sciatic nerve. This condition happens when the sciatic nerve is pinched or has pressure put on it. Sciatica can cause pain, tingling, or loss of feeling (numbness) in the lower back, legs, hips, and butt. Treatment often includes rest, exercise, medicines, and putting ice or heat on the affected area. This information is not intended to replace advice given to you by your health care provider. Make sure you discuss any questions you have with your healthcare provider. Document Revised: 09/16/2018 Document Reviewed: 09/16/2018 Elsevier  Patient Education  2022 [employer].

## 2021-03-22 ENCOUNTER — Other Ambulatory Visit: Payer: Self-pay

## 2021-03-22 ENCOUNTER — Encounter: Payer: Self-pay | Admitting: Orthopaedic Surgery

## 2021-03-22 ENCOUNTER — Ambulatory Visit (INDEPENDENT_AMBULATORY_CARE_PROVIDER_SITE_OTHER): Payer: HMO | Admitting: Orthopaedic Surgery

## 2021-03-22 VITALS — BP 123/61 | HR 92 | Ht 62.0 in | Wt 203.2 lb

## 2021-03-22 DIAGNOSIS — M5442 Lumbago with sciatica, left side: Secondary | ICD-10-CM

## 2021-03-22 NOTE — Progress Notes (Signed)
I am much better.  Her back and hip pain are much improved after being on Aleve bid for a week.  She is walking and feeling better.  NV intact.  Spine/Pelvis examination:  Inspection:  Overall, sacoiliac joint benign and hips nontender; without crepitus or defects.   Thoracic spine inspection: Alignment normal without kyphosis present   Lumbar spine inspection:  Alignment  with normal lumbar lordosis, without scoliosis apparent.   Thoracic spine palpation:  without tenderness of spinal processes   Lumbar spine palpation: without tenderness of lumbar area; without tightness of lumbar muscles    Range of Motion:   Lumbar flexion, forward flexion is normal without pain or tenderness    Lumbar extension is full without pain or tenderness   Left lateral bend is normal without pain or tenderness   Right lateral bend is normal without pain or tenderness   Straight leg raising is normal  Strength & tone: normal   Stability overall normal stability Encounter Diagnosis  Name Primary?   Acute left-sided low back pain with left-sided sciatica Yes   I will see as needed.  Call if any problem.  Precautions discussed.  Electronically Signed Darreld Mclean, MD 7/12/20222:11 PM

## 2021-06-13 DIAGNOSIS — I10 Essential (primary) hypertension: Secondary | ICD-10-CM | POA: Diagnosis not present

## 2021-06-13 DIAGNOSIS — E1122 Type 2 diabetes mellitus with diabetic chronic kidney disease: Secondary | ICD-10-CM | POA: Diagnosis not present

## 2021-06-13 DIAGNOSIS — E875 Hyperkalemia: Secondary | ICD-10-CM | POA: Diagnosis not present

## 2021-06-13 DIAGNOSIS — N183 Chronic kidney disease, stage 3 unspecified: Secondary | ICD-10-CM | POA: Diagnosis not present

## 2021-06-13 DIAGNOSIS — Z1159 Encounter for screening for other viral diseases: Secondary | ICD-10-CM | POA: Diagnosis not present

## 2021-06-13 DIAGNOSIS — E782 Mixed hyperlipidemia: Secondary | ICD-10-CM | POA: Diagnosis not present

## 2021-06-13 DIAGNOSIS — E7849 Other hyperlipidemia: Secondary | ICD-10-CM | POA: Diagnosis not present

## 2021-06-17 DIAGNOSIS — E1140 Type 2 diabetes mellitus with diabetic neuropathy, unspecified: Secondary | ICD-10-CM | POA: Diagnosis not present

## 2021-06-17 DIAGNOSIS — E7849 Other hyperlipidemia: Secondary | ICD-10-CM | POA: Diagnosis not present

## 2021-06-17 DIAGNOSIS — Z23 Encounter for immunization: Secondary | ICD-10-CM | POA: Diagnosis not present

## 2021-06-17 DIAGNOSIS — E1165 Type 2 diabetes mellitus with hyperglycemia: Secondary | ICD-10-CM | POA: Diagnosis not present

## 2021-06-17 DIAGNOSIS — Z1212 Encounter for screening for malignant neoplasm of rectum: Secondary | ICD-10-CM | POA: Diagnosis not present

## 2021-06-17 DIAGNOSIS — I10 Essential (primary) hypertension: Secondary | ICD-10-CM | POA: Diagnosis not present

## 2021-06-17 DIAGNOSIS — Z0001 Encounter for general adult medical examination with abnormal findings: Secondary | ICD-10-CM | POA: Diagnosis not present

## 2021-06-17 DIAGNOSIS — E1122 Type 2 diabetes mellitus with diabetic chronic kidney disease: Secondary | ICD-10-CM | POA: Diagnosis not present

## 2021-10-19 DIAGNOSIS — E782 Mixed hyperlipidemia: Secondary | ICD-10-CM | POA: Diagnosis not present

## 2021-10-19 DIAGNOSIS — I10 Essential (primary) hypertension: Secondary | ICD-10-CM | POA: Diagnosis not present

## 2021-10-19 DIAGNOSIS — E875 Hyperkalemia: Secondary | ICD-10-CM | POA: Diagnosis not present

## 2021-10-19 DIAGNOSIS — E7800 Pure hypercholesterolemia, unspecified: Secondary | ICD-10-CM | POA: Diagnosis not present

## 2021-10-19 DIAGNOSIS — E7849 Other hyperlipidemia: Secondary | ICD-10-CM | POA: Diagnosis not present

## 2021-10-19 DIAGNOSIS — E1122 Type 2 diabetes mellitus with diabetic chronic kidney disease: Secondary | ICD-10-CM | POA: Diagnosis not present

## 2021-10-19 DIAGNOSIS — E1165 Type 2 diabetes mellitus with hyperglycemia: Secondary | ICD-10-CM | POA: Diagnosis not present

## 2021-10-19 DIAGNOSIS — E1140 Type 2 diabetes mellitus with diabetic neuropathy, unspecified: Secondary | ICD-10-CM | POA: Diagnosis not present

## 2021-10-19 DIAGNOSIS — E7801 Familial hypercholesterolemia: Secondary | ICD-10-CM | POA: Diagnosis not present

## 2021-10-19 DIAGNOSIS — N183 Chronic kidney disease, stage 3 unspecified: Secondary | ICD-10-CM | POA: Diagnosis not present

## 2021-10-21 DIAGNOSIS — E1122 Type 2 diabetes mellitus with diabetic chronic kidney disease: Secondary | ICD-10-CM | POA: Diagnosis not present

## 2021-10-21 DIAGNOSIS — I10 Essential (primary) hypertension: Secondary | ICD-10-CM | POA: Diagnosis not present

## 2021-10-21 DIAGNOSIS — E7849 Other hyperlipidemia: Secondary | ICD-10-CM | POA: Diagnosis not present

## 2021-10-21 DIAGNOSIS — Z6835 Body mass index (BMI) 35.0-35.9, adult: Secondary | ICD-10-CM | POA: Diagnosis not present

## 2021-10-21 DIAGNOSIS — E875 Hyperkalemia: Secondary | ICD-10-CM | POA: Diagnosis not present

## 2021-10-31 DIAGNOSIS — S39012A Strain of muscle, fascia and tendon of lower back, initial encounter: Secondary | ICD-10-CM | POA: Diagnosis not present

## 2021-10-31 DIAGNOSIS — Z6834 Body mass index (BMI) 34.0-34.9, adult: Secondary | ICD-10-CM | POA: Diagnosis not present

## 2021-11-28 DIAGNOSIS — Z1231 Encounter for screening mammogram for malignant neoplasm of breast: Secondary | ICD-10-CM | POA: Diagnosis not present

## 2022-02-14 DIAGNOSIS — E1165 Type 2 diabetes mellitus with hyperglycemia: Secondary | ICD-10-CM | POA: Diagnosis not present

## 2022-02-14 DIAGNOSIS — E875 Hyperkalemia: Secondary | ICD-10-CM | POA: Diagnosis not present

## 2022-02-14 DIAGNOSIS — E1140 Type 2 diabetes mellitus with diabetic neuropathy, unspecified: Secondary | ICD-10-CM | POA: Diagnosis not present

## 2022-02-14 DIAGNOSIS — E7849 Other hyperlipidemia: Secondary | ICD-10-CM | POA: Diagnosis not present

## 2022-02-14 DIAGNOSIS — E7801 Familial hypercholesterolemia: Secondary | ICD-10-CM | POA: Diagnosis not present

## 2022-02-14 DIAGNOSIS — E7800 Pure hypercholesterolemia, unspecified: Secondary | ICD-10-CM | POA: Diagnosis not present

## 2022-02-14 DIAGNOSIS — E782 Mixed hyperlipidemia: Secondary | ICD-10-CM | POA: Diagnosis not present

## 2022-02-14 DIAGNOSIS — E1122 Type 2 diabetes mellitus with diabetic chronic kidney disease: Secondary | ICD-10-CM | POA: Diagnosis not present

## 2022-03-02 DIAGNOSIS — Z6835 Body mass index (BMI) 35.0-35.9, adult: Secondary | ICD-10-CM | POA: Diagnosis not present

## 2022-03-02 DIAGNOSIS — E7849 Other hyperlipidemia: Secondary | ICD-10-CM | POA: Diagnosis not present

## 2022-03-02 DIAGNOSIS — I10 Essential (primary) hypertension: Secondary | ICD-10-CM | POA: Diagnosis not present

## 2022-03-02 DIAGNOSIS — E875 Hyperkalemia: Secondary | ICD-10-CM | POA: Diagnosis not present

## 2022-03-02 DIAGNOSIS — E1122 Type 2 diabetes mellitus with diabetic chronic kidney disease: Secondary | ICD-10-CM | POA: Diagnosis not present

## 2022-04-04 DIAGNOSIS — Z1211 Encounter for screening for malignant neoplasm of colon: Secondary | ICD-10-CM | POA: Diagnosis not present

## 2022-04-04 DIAGNOSIS — Z1212 Encounter for screening for malignant neoplasm of rectum: Secondary | ICD-10-CM | POA: Diagnosis not present

## 2022-06-12 DIAGNOSIS — Z6835 Body mass index (BMI) 35.0-35.9, adult: Secondary | ICD-10-CM | POA: Diagnosis not present

## 2022-06-12 DIAGNOSIS — J020 Streptococcal pharyngitis: Secondary | ICD-10-CM | POA: Diagnosis not present

## 2022-10-30 DIAGNOSIS — E875 Hyperkalemia: Secondary | ICD-10-CM | POA: Diagnosis not present

## 2022-10-30 DIAGNOSIS — E1140 Type 2 diabetes mellitus with diabetic neuropathy, unspecified: Secondary | ICD-10-CM | POA: Diagnosis not present

## 2022-10-30 DIAGNOSIS — E7849 Other hyperlipidemia: Secondary | ICD-10-CM | POA: Diagnosis not present

## 2022-10-30 DIAGNOSIS — E782 Mixed hyperlipidemia: Secondary | ICD-10-CM | POA: Diagnosis not present

## 2022-10-30 DIAGNOSIS — E7800 Pure hypercholesterolemia, unspecified: Secondary | ICD-10-CM | POA: Diagnosis not present

## 2022-10-30 DIAGNOSIS — E1122 Type 2 diabetes mellitus with diabetic chronic kidney disease: Secondary | ICD-10-CM | POA: Diagnosis not present

## 2022-10-30 DIAGNOSIS — E7801 Familial hypercholesterolemia: Secondary | ICD-10-CM | POA: Diagnosis not present

## 2022-10-30 DIAGNOSIS — E1165 Type 2 diabetes mellitus with hyperglycemia: Secondary | ICD-10-CM | POA: Diagnosis not present

## 2022-11-06 DIAGNOSIS — Z6835 Body mass index (BMI) 35.0-35.9, adult: Secondary | ICD-10-CM | POA: Diagnosis not present

## 2022-11-06 DIAGNOSIS — N1831 Chronic kidney disease, stage 3a: Secondary | ICD-10-CM | POA: Diagnosis not present

## 2022-11-06 DIAGNOSIS — E875 Hyperkalemia: Secondary | ICD-10-CM | POA: Diagnosis not present

## 2022-11-06 DIAGNOSIS — E7849 Other hyperlipidemia: Secondary | ICD-10-CM | POA: Diagnosis not present

## 2022-11-06 DIAGNOSIS — I10 Essential (primary) hypertension: Secondary | ICD-10-CM | POA: Diagnosis not present

## 2022-11-06 DIAGNOSIS — E1122 Type 2 diabetes mellitus with diabetic chronic kidney disease: Secondary | ICD-10-CM | POA: Diagnosis not present

## 2022-11-09 DIAGNOSIS — H40013 Open angle with borderline findings, low risk, bilateral: Secondary | ICD-10-CM | POA: Diagnosis not present

## 2022-11-30 DIAGNOSIS — Z1231 Encounter for screening mammogram for malignant neoplasm of breast: Secondary | ICD-10-CM | POA: Diagnosis not present

## 2023-01-31 ENCOUNTER — Ambulatory Visit: Payer: HMO | Admitting: Orthopaedic Surgery

## 2023-02-06 DIAGNOSIS — H353131 Nonexudative age-related macular degeneration, bilateral, early dry stage: Secondary | ICD-10-CM | POA: Diagnosis not present

## 2023-02-06 DIAGNOSIS — H40013 Open angle with borderline findings, low risk, bilateral: Secondary | ICD-10-CM | POA: Diagnosis not present

## 2023-02-06 DIAGNOSIS — Z7984 Long term (current) use of oral hypoglycemic drugs: Secondary | ICD-10-CM | POA: Diagnosis not present

## 2023-02-06 DIAGNOSIS — H2513 Age-related nuclear cataract, bilateral: Secondary | ICD-10-CM | POA: Diagnosis not present

## 2023-02-06 DIAGNOSIS — E119 Type 2 diabetes mellitus without complications: Secondary | ICD-10-CM | POA: Diagnosis not present

## 2023-03-01 DIAGNOSIS — E7849 Other hyperlipidemia: Secondary | ICD-10-CM | POA: Diagnosis not present

## 2023-03-01 DIAGNOSIS — E875 Hyperkalemia: Secondary | ICD-10-CM | POA: Diagnosis not present

## 2023-03-01 DIAGNOSIS — E1140 Type 2 diabetes mellitus with diabetic neuropathy, unspecified: Secondary | ICD-10-CM | POA: Diagnosis not present

## 2023-03-01 DIAGNOSIS — I10 Essential (primary) hypertension: Secondary | ICD-10-CM | POA: Diagnosis not present

## 2023-03-01 DIAGNOSIS — N1831 Chronic kidney disease, stage 3a: Secondary | ICD-10-CM | POA: Diagnosis not present

## 2023-03-01 DIAGNOSIS — E1122 Type 2 diabetes mellitus with diabetic chronic kidney disease: Secondary | ICD-10-CM | POA: Diagnosis not present

## 2023-03-07 DIAGNOSIS — Z6834 Body mass index (BMI) 34.0-34.9, adult: Secondary | ICD-10-CM | POA: Diagnosis not present

## 2023-03-07 DIAGNOSIS — E7849 Other hyperlipidemia: Secondary | ICD-10-CM | POA: Diagnosis not present

## 2023-03-07 DIAGNOSIS — N1831 Chronic kidney disease, stage 3a: Secondary | ICD-10-CM | POA: Diagnosis not present

## 2023-03-07 DIAGNOSIS — I10 Essential (primary) hypertension: Secondary | ICD-10-CM | POA: Diagnosis not present

## 2023-03-07 DIAGNOSIS — E1122 Type 2 diabetes mellitus with diabetic chronic kidney disease: Secondary | ICD-10-CM | POA: Diagnosis not present

## 2023-03-07 DIAGNOSIS — E875 Hyperkalemia: Secondary | ICD-10-CM | POA: Diagnosis not present

## 2023-03-07 DIAGNOSIS — Z23 Encounter for immunization: Secondary | ICD-10-CM | POA: Diagnosis not present

## 2023-05-11 DIAGNOSIS — H40013 Open angle with borderline findings, low risk, bilateral: Secondary | ICD-10-CM | POA: Diagnosis not present

## 2023-05-11 DIAGNOSIS — H353131 Nonexudative age-related macular degeneration, bilateral, early dry stage: Secondary | ICD-10-CM | POA: Diagnosis not present

## 2023-07-10 DIAGNOSIS — E1122 Type 2 diabetes mellitus with diabetic chronic kidney disease: Secondary | ICD-10-CM | POA: Diagnosis not present

## 2023-07-10 DIAGNOSIS — E1165 Type 2 diabetes mellitus with hyperglycemia: Secondary | ICD-10-CM | POA: Diagnosis not present

## 2023-07-10 DIAGNOSIS — E875 Hyperkalemia: Secondary | ICD-10-CM | POA: Diagnosis not present

## 2023-07-10 DIAGNOSIS — E7849 Other hyperlipidemia: Secondary | ICD-10-CM | POA: Diagnosis not present

## 2023-07-10 DIAGNOSIS — N1831 Chronic kidney disease, stage 3a: Secondary | ICD-10-CM | POA: Diagnosis not present

## 2023-07-16 DIAGNOSIS — E7849 Other hyperlipidemia: Secondary | ICD-10-CM | POA: Diagnosis not present

## 2023-07-16 DIAGNOSIS — E875 Hyperkalemia: Secondary | ICD-10-CM | POA: Diagnosis not present

## 2023-07-16 DIAGNOSIS — E1122 Type 2 diabetes mellitus with diabetic chronic kidney disease: Secondary | ICD-10-CM | POA: Diagnosis not present

## 2023-07-16 DIAGNOSIS — Z6833 Body mass index (BMI) 33.0-33.9, adult: Secondary | ICD-10-CM | POA: Diagnosis not present

## 2023-07-16 DIAGNOSIS — Z23 Encounter for immunization: Secondary | ICD-10-CM | POA: Diagnosis not present

## 2023-07-16 DIAGNOSIS — I10 Essential (primary) hypertension: Secondary | ICD-10-CM | POA: Diagnosis not present

## 2023-07-16 DIAGNOSIS — Z0001 Encounter for general adult medical examination with abnormal findings: Secondary | ICD-10-CM | POA: Diagnosis not present

## 2023-07-16 DIAGNOSIS — N1831 Chronic kidney disease, stage 3a: Secondary | ICD-10-CM | POA: Diagnosis not present

## 2023-08-01 ENCOUNTER — Other Ambulatory Visit (INDEPENDENT_AMBULATORY_CARE_PROVIDER_SITE_OTHER): Payer: PPO

## 2023-08-01 ENCOUNTER — Ambulatory Visit: Payer: PPO | Admitting: Orthopaedic Surgery

## 2023-08-01 ENCOUNTER — Encounter: Payer: Self-pay | Admitting: Orthopaedic Surgery

## 2023-08-01 VITALS — BP 128/78 | HR 91 | Ht 62.0 in | Wt 189.0 lb

## 2023-08-01 DIAGNOSIS — M25572 Pain in left ankle and joints of left foot: Secondary | ICD-10-CM | POA: Diagnosis not present

## 2023-08-01 DIAGNOSIS — M79672 Pain in left foot: Secondary | ICD-10-CM

## 2023-08-01 NOTE — Progress Notes (Signed)
My left foot was really hurting bad last Friday.  She has marked pain of the dorsum of the mid foot Friday last week, November 15.  She could hardly stand or walk.  She elevated the foot, used ice/heat, took Naprosyn and the pain has completely resolved. She wanted to check it out today.  She had been up on the foot more on the 14th making soup for her seven grandchildren. It was also a cold, rainy day.  She had no trauma, no redness, no numbness.  She has no gout in the family.  The left ankle had surgery medial malleolus years ago.  She has chronic lateral swelling since then.  Left ankle has full ROM, no redness. She is only slightly tender mid dorsal foot on the left.  She has lateral ankle edema.  NV intact.  Gait is normal.  No limp.   X-rays were done of the left ankle and foot, reported separately.  Encounter Diagnoses  Name Primary?   Pain in left foot Yes   Pain in left ankle and joints of left foot    She has talonavicular degenerative changes which may have flared up.  She is better today and has no pain.  I will observe for now.  Let me know if it gets worse.  I will see prn.  Call if any problem.  Precautions discussed.  Electronically Signed Darreld Mclean, MD 11/20/202410:51 AM

## 2023-08-07 MED ORDER — METHYLPREDNISOLONE ACETATE 40 MG/ML IJ SUSP
40.0000 mg | Freq: Once | INTRAMUSCULAR | Status: AC
  Administered 2023-08-07: 40 mg via INTRA_ARTICULAR

## 2023-08-07 NOTE — Addendum Note (Signed)
Addended by: Michaele Offer on: 08/07/2023 08:18 AM   Modules accepted: Orders

## 2023-09-08 DIAGNOSIS — H40013 Open angle with borderline findings, low risk, bilateral: Secondary | ICD-10-CM | POA: Diagnosis not present

## 2023-11-14 DIAGNOSIS — E875 Hyperkalemia: Secondary | ICD-10-CM | POA: Diagnosis not present

## 2023-11-14 DIAGNOSIS — N1831 Chronic kidney disease, stage 3a: Secondary | ICD-10-CM | POA: Diagnosis not present

## 2023-11-14 DIAGNOSIS — E782 Mixed hyperlipidemia: Secondary | ICD-10-CM | POA: Diagnosis not present

## 2023-11-14 DIAGNOSIS — E7849 Other hyperlipidemia: Secondary | ICD-10-CM | POA: Diagnosis not present

## 2023-11-14 DIAGNOSIS — E119 Type 2 diabetes mellitus without complications: Secondary | ICD-10-CM | POA: Diagnosis not present

## 2023-11-14 DIAGNOSIS — I10 Essential (primary) hypertension: Secondary | ICD-10-CM | POA: Diagnosis not present

## 2023-11-20 DIAGNOSIS — I10 Essential (primary) hypertension: Secondary | ICD-10-CM | POA: Diagnosis not present

## 2023-11-20 DIAGNOSIS — E782 Mixed hyperlipidemia: Secondary | ICD-10-CM | POA: Diagnosis not present

## 2023-11-20 DIAGNOSIS — N1831 Chronic kidney disease, stage 3a: Secondary | ICD-10-CM | POA: Diagnosis not present

## 2023-11-20 DIAGNOSIS — E875 Hyperkalemia: Secondary | ICD-10-CM | POA: Diagnosis not present

## 2023-11-20 DIAGNOSIS — Z6834 Body mass index (BMI) 34.0-34.9, adult: Secondary | ICD-10-CM | POA: Diagnosis not present

## 2023-12-04 DIAGNOSIS — Z1231 Encounter for screening mammogram for malignant neoplasm of breast: Secondary | ICD-10-CM | POA: Diagnosis not present

## 2024-01-02 DIAGNOSIS — H2513 Age-related nuclear cataract, bilateral: Secondary | ICD-10-CM | POA: Diagnosis not present

## 2024-01-02 DIAGNOSIS — H5203 Hypermetropia, bilateral: Secondary | ICD-10-CM | POA: Diagnosis not present

## 2024-01-02 DIAGNOSIS — H40003 Preglaucoma, unspecified, bilateral: Secondary | ICD-10-CM | POA: Diagnosis not present

## 2024-03-05 ENCOUNTER — Other Ambulatory Visit: Payer: Self-pay | Admitting: Optometry

## 2024-03-05 DIAGNOSIS — E119 Type 2 diabetes mellitus without complications: Secondary | ICD-10-CM | POA: Diagnosis not present

## 2024-03-05 DIAGNOSIS — H40003 Preglaucoma, unspecified, bilateral: Secondary | ICD-10-CM | POA: Diagnosis not present

## 2024-03-05 DIAGNOSIS — H2513 Age-related nuclear cataract, bilateral: Secondary | ICD-10-CM | POA: Diagnosis not present

## 2024-03-05 DIAGNOSIS — H53461 Homonymous bilateral field defects, right side: Secondary | ICD-10-CM | POA: Diagnosis not present

## 2024-03-05 NOTE — Progress Notes (Signed)
 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 MRI brain to assess further.

## 2024-03-17 ENCOUNTER — Ambulatory Visit (HOSPITAL_COMMUNITY)
Admission: RE | Admit: 2024-03-17 | Discharge: 2024-03-17 | Disposition: A | Discharge status: Home / Self Care | Source: Ambulatory Visit | Attending: Optometry | Admitting: Optometry

## 2024-03-17 DIAGNOSIS — R531 Weakness: Secondary | ICD-10-CM | POA: Diagnosis not present

## 2024-03-17 DIAGNOSIS — H53461 Homonymous bilateral field defects, right side: Secondary | ICD-10-CM | POA: Diagnosis not present

## 2024-03-17 DIAGNOSIS — R410 Disorientation, unspecified: Secondary | ICD-10-CM | POA: Diagnosis not present

## 2024-03-17 MED ORDER — GADOBUTROL 1 MMOL/ML IV SOLN
9.0000 mL | Freq: Once | INTRAVENOUS | Status: AC | PRN
  Administered 2024-03-17: 9 mL via INTRAVENOUS

## 2024-04-04 DIAGNOSIS — E7849 Other hyperlipidemia: Secondary | ICD-10-CM | POA: Diagnosis not present

## 2024-04-04 DIAGNOSIS — I10 Essential (primary) hypertension: Secondary | ICD-10-CM | POA: Diagnosis not present

## 2024-04-04 DIAGNOSIS — E875 Hyperkalemia: Secondary | ICD-10-CM | POA: Diagnosis not present

## 2024-04-04 DIAGNOSIS — E1122 Type 2 diabetes mellitus with diabetic chronic kidney disease: Secondary | ICD-10-CM | POA: Diagnosis not present

## 2024-04-04 DIAGNOSIS — N1831 Chronic kidney disease, stage 3a: Secondary | ICD-10-CM | POA: Diagnosis not present

## 2024-04-04 DIAGNOSIS — E119 Type 2 diabetes mellitus without complications: Secondary | ICD-10-CM | POA: Diagnosis not present

## 2024-04-08 DIAGNOSIS — E6609 Other obesity due to excess calories: Secondary | ICD-10-CM | POA: Diagnosis not present

## 2024-04-08 DIAGNOSIS — E782 Mixed hyperlipidemia: Secondary | ICD-10-CM | POA: Diagnosis not present

## 2024-04-08 DIAGNOSIS — I10 Essential (primary) hypertension: Secondary | ICD-10-CM | POA: Diagnosis not present

## 2024-04-08 DIAGNOSIS — E875 Hyperkalemia: Secondary | ICD-10-CM | POA: Diagnosis not present

## 2024-04-08 DIAGNOSIS — Z6833 Body mass index (BMI) 33.0-33.9, adult: Secondary | ICD-10-CM | POA: Diagnosis not present

## 2024-04-08 DIAGNOSIS — Z23 Encounter for immunization: Secondary | ICD-10-CM | POA: Diagnosis not present

## 2024-06-04 DIAGNOSIS — H2513 Age-related nuclear cataract, bilateral: Secondary | ICD-10-CM | POA: Diagnosis not present

## 2024-06-04 DIAGNOSIS — H53462 Homonymous bilateral field defects, left side: Secondary | ICD-10-CM | POA: Diagnosis not present

## 2024-06-04 DIAGNOSIS — H5203 Hypermetropia, bilateral: Secondary | ICD-10-CM | POA: Diagnosis not present

## 2024-06-04 DIAGNOSIS — H40003 Preglaucoma, unspecified, bilateral: Secondary | ICD-10-CM | POA: Diagnosis not present

## 2024-06-04 DIAGNOSIS — H53461 Homonymous bilateral field defects, right side: Secondary | ICD-10-CM | POA: Diagnosis not present

## 2024-08-06 DIAGNOSIS — E875 Hyperkalemia: Secondary | ICD-10-CM | POA: Diagnosis not present

## 2024-08-06 DIAGNOSIS — E7849 Other hyperlipidemia: Secondary | ICD-10-CM | POA: Diagnosis not present

## 2024-08-06 DIAGNOSIS — E1122 Type 2 diabetes mellitus with diabetic chronic kidney disease: Secondary | ICD-10-CM | POA: Diagnosis not present

## 2024-08-11 DIAGNOSIS — E875 Hyperkalemia: Secondary | ICD-10-CM | POA: Diagnosis not present

## 2024-08-11 DIAGNOSIS — Z1331 Encounter for screening for depression: Secondary | ICD-10-CM | POA: Diagnosis not present

## 2024-08-11 DIAGNOSIS — I10 Essential (primary) hypertension: Secondary | ICD-10-CM | POA: Diagnosis not present

## 2024-08-11 DIAGNOSIS — Z0001 Encounter for general adult medical examination with abnormal findings: Secondary | ICD-10-CM | POA: Diagnosis not present

## 2024-08-11 DIAGNOSIS — Z1389 Encounter for screening for other disorder: Secondary | ICD-10-CM | POA: Diagnosis not present

## 2024-08-11 DIAGNOSIS — E1121 Type 2 diabetes mellitus with diabetic nephropathy: Secondary | ICD-10-CM | POA: Diagnosis not present

## 2024-08-11 DIAGNOSIS — Z6833 Body mass index (BMI) 33.0-33.9, adult: Secondary | ICD-10-CM | POA: Diagnosis not present

## 2024-08-11 DIAGNOSIS — N1831 Chronic kidney disease, stage 3a: Secondary | ICD-10-CM | POA: Diagnosis not present
